# Patient Record
Sex: Female | Born: 1945 | Race: White | Hispanic: No | Marital: Married
Health system: Midwestern US, Community
[De-identification: ages and names within clinical notes are randomized; demographics above are authoritative.]

## PROBLEM LIST (undated history)

## (undated) DIAGNOSIS — I10 Essential (primary) hypertension: Secondary | ICD-10-CM

## (undated) DIAGNOSIS — E119 Type 2 diabetes mellitus without complications: Secondary | ICD-10-CM

## (undated) DIAGNOSIS — M1990 Unspecified osteoarthritis, unspecified site: Secondary | ICD-10-CM

## (undated) DIAGNOSIS — N816 Rectocele: Secondary | ICD-10-CM

## (undated) HISTORY — PX: CHOLECYSTECTOMY: SHX55

## (undated) HISTORY — PX: KNEE ARTHROSCOPY: SUR90

## (undated) HISTORY — PX: APPENDECTOMY: SHX54

---

## 2003-07-29 ENCOUNTER — Other Ambulatory Visit: Payer: Self-pay

## 2004-11-11 ENCOUNTER — Emergency Department: Payer: Self-pay | Admitting: Emergency Medicine

## 2006-03-28 ENCOUNTER — Emergency Department: Payer: Self-pay | Admitting: Emergency Medicine

## 2010-12-20 ENCOUNTER — Emergency Department: Payer: Self-pay | Admitting: Emergency Medicine

## 2012-08-07 ENCOUNTER — Emergency Department: Payer: Self-pay | Admitting: Emergency Medicine

## 2012-08-07 LAB — COMPREHENSIVE METABOLIC PANEL
Alkaline Phosphatase: 100 U/L (ref 50–136)
Anion Gap: 5 — ABNORMAL LOW (ref 7–16)
BUN: 20 mg/dL — ABNORMAL HIGH (ref 7–18)
Bilirubin,Total: 0.3 mg/dL (ref 0.2–1.0)
Calcium, Total: 8.7 mg/dL (ref 8.5–10.1)
Chloride: 104 mmol/L (ref 98–107)
Co2: 25 mmol/L (ref 21–32)
EGFR (African American): 43 — ABNORMAL LOW
EGFR (Non-African Amer.): 37 — ABNORMAL LOW
Glucose: 131 mg/dL — ABNORMAL HIGH (ref 65–99)
Potassium: 4.3 mmol/L (ref 3.5–5.1)
SGOT(AST): 24 U/L (ref 15–37)
SGPT (ALT): 17 U/L (ref 12–78)
Total Protein: 7.4 g/dL (ref 6.4–8.2)

## 2012-08-07 LAB — URINALYSIS, COMPLETE
Bilirubin,UR: NEGATIVE
Glucose,UR: NEGATIVE mg/dL (ref 0–75)
Hyaline Cast: 3
Nitrite: NEGATIVE
Ph: 6 (ref 4.5–8.0)
Protein: NEGATIVE
RBC,UR: 5 /HPF (ref 0–5)
Squamous Epithelial: 2

## 2012-08-07 LAB — CBC
HCT: 37 % (ref 35.0–47.0)
HGB: 11.7 g/dL — ABNORMAL LOW (ref 12.0–16.0)
MCH: 26 pg (ref 26.0–34.0)
MCHC: 31.5 g/dL — ABNORMAL LOW (ref 32.0–36.0)
MCV: 82 fL (ref 80–100)
Platelet: 304 10*3/uL (ref 150–440)
RDW: 15.3 % — ABNORMAL HIGH (ref 11.5–14.5)

## 2012-08-07 LAB — LIPASE, BLOOD: Lipase: 115 U/L (ref 73–393)

## 2015-03-02 ENCOUNTER — Emergency Department: Payer: Medicare Other

## 2015-03-02 ENCOUNTER — Inpatient Hospital Stay
Admission: EM | Admit: 2015-03-02 | Discharge: 2015-03-03 | DRG: 176 | Disposition: A | Discharge status: Home / Self Care | Payer: Medicare Other | Attending: Internal Medicine | Admitting: Internal Medicine

## 2015-03-02 ENCOUNTER — Encounter: Payer: Self-pay | Admitting: General Practice

## 2015-03-02 DIAGNOSIS — R55 Syncope and collapse: Secondary | ICD-10-CM

## 2015-03-02 DIAGNOSIS — M069 Rheumatoid arthritis, unspecified: Secondary | ICD-10-CM | POA: Diagnosis present

## 2015-03-02 DIAGNOSIS — I10 Essential (primary) hypertension: Secondary | ICD-10-CM | POA: Diagnosis present

## 2015-03-02 DIAGNOSIS — I2699 Other pulmonary embolism without acute cor pulmonale: Principal | ICD-10-CM | POA: Diagnosis present

## 2015-03-02 DIAGNOSIS — I340 Nonrheumatic mitral (valve) insufficiency: Secondary | ICD-10-CM | POA: Diagnosis not present

## 2015-03-02 DIAGNOSIS — D6859 Other primary thrombophilia: Secondary | ICD-10-CM | POA: Diagnosis present

## 2015-03-02 DIAGNOSIS — Z794 Long term (current) use of insulin: Secondary | ICD-10-CM | POA: Diagnosis not present

## 2015-03-02 DIAGNOSIS — R0602 Shortness of breath: Secondary | ICD-10-CM

## 2015-03-02 DIAGNOSIS — Z882 Allergy status to sulfonamides status: Secondary | ICD-10-CM

## 2015-03-02 DIAGNOSIS — E119 Type 2 diabetes mellitus without complications: Secondary | ICD-10-CM | POA: Diagnosis present

## 2015-03-02 HISTORY — DX: Unspecified osteoarthritis, unspecified site: M19.90

## 2015-03-02 HISTORY — DX: Type 2 diabetes mellitus without complications: E11.9

## 2015-03-02 HISTORY — DX: Essential (primary) hypertension: I10

## 2015-03-02 LAB — CBC
HCT: 38.4 % (ref 35.0–47.0)
Hemoglobin: 12.6 g/dL (ref 12.0–16.0)
MCH: 26.8 pg (ref 26.0–34.0)
MCHC: 32.8 g/dL (ref 32.0–36.0)
MCV: 81.7 fL (ref 80.0–100.0)
PLATELETS: 190 10*3/uL (ref 150–440)
RBC: 4.7 MIL/uL (ref 3.80–5.20)
RDW: 15.4 % — AB (ref 11.5–14.5)
WBC: 10.3 10*3/uL (ref 3.6–11.0)

## 2015-03-02 LAB — TROPONIN I
TROPONIN I: 0.03 ng/mL (ref <0.031)
TROPONIN I: 0.04 ng/mL — AB (ref <0.031)

## 2015-03-02 LAB — BASIC METABOLIC PANEL
ANION GAP: 10 (ref 5–15)
BUN: 18 mg/dL (ref 6–20)
CALCIUM: 8.5 mg/dL — AB (ref 8.9–10.3)
CO2: 24 mmol/L (ref 22–32)
CREATININE: 1.28 mg/dL — AB (ref 0.44–1.00)
Chloride: 98 mmol/L — ABNORMAL LOW (ref 101–111)
GFR, EST AFRICAN AMERICAN: 48 mL/min — AB (ref >60)
GFR, EST NON AFRICAN AMERICAN: 42 mL/min — AB (ref >60)
Glucose, Bld: 220 mg/dL — ABNORMAL HIGH (ref 65–99)
Potassium: 4.6 mmol/L (ref 3.5–5.1)
SODIUM: 132 mmol/L — AB (ref 135–145)

## 2015-03-02 LAB — PROTIME-INR
INR: 0.99
Prothrombin Time: 13.3 seconds (ref 11.4–15.0)

## 2015-03-02 LAB — GLUCOSE, CAPILLARY: Glucose-Capillary: 167 mg/dL — ABNORMAL HIGH (ref 65–99)

## 2015-03-02 LAB — APTT: aPTT: 23 seconds — ABNORMAL LOW (ref 24–36)

## 2015-03-02 LAB — BRAIN NATRIURETIC PEPTIDE: B Natriuretic Peptide: 103 pg/mL — ABNORMAL HIGH (ref 0.0–100.0)

## 2015-03-02 MED ORDER — ONDANSETRON HCL 4 MG PO TABS: 4.0000 mg | ORAL_TABLET | Freq: Four times a day (QID) | ORAL | Status: DC | PRN

## 2015-03-02 MED ORDER — HEPARIN BOLUS VIA INFUSION
4000.0000 [IU] | Freq: Once | INTRAVENOUS | Status: AC
  Administered 2015-03-02: 4000 [IU] via INTRAVENOUS
  Filled 2015-03-02: qty 4000

## 2015-03-02 MED ORDER — SODIUM CHLORIDE 0.9 % IV SOLN
INTRAVENOUS | Status: DC
  Administered 2015-03-02: 22:00:00 via INTRAVENOUS

## 2015-03-02 MED ORDER — LISINOPRIL 5 MG PO TABS
5.0000 mg | ORAL_TABLET | Freq: Every day | ORAL | Status: DC
  Administered 2015-03-03: 5 mg via ORAL
  Filled 2015-03-02: qty 1

## 2015-03-02 MED ORDER — INSULIN ASPART 100 UNIT/ML ~~LOC~~ SOLN
21.0000 [IU] | Freq: Three times a day (TID) | SUBCUTANEOUS | Status: DC
  Administered 2015-03-03: 21 [IU] via SUBCUTANEOUS
  Filled 2015-03-02: qty 21

## 2015-03-02 MED ORDER — HEPARIN (PORCINE) IN NACL 100-0.45 UNIT/ML-% IJ SOLN
1200.0000 [IU]/h | INTRAMUSCULAR | Status: DC
  Administered 2015-03-02: 1200 [IU]/h via INTRAVENOUS
  Filled 2015-03-02 (×3): qty 250

## 2015-03-02 MED ORDER — IOHEXOL 350 MG/ML SOLN
100.0000 mL | Freq: Once | INTRAVENOUS | Status: AC | PRN
  Administered 2015-03-02: 100 mL via INTRAVENOUS

## 2015-03-02 MED ORDER — ACETAMINOPHEN 650 MG RE SUPP: 650.0000 mg | Freq: Four times a day (QID) | RECTAL | Status: DC | PRN

## 2015-03-02 MED ORDER — IOHEXOL 300 MG/ML  SOLN: 100.0000 mL | Freq: Once | INTRAMUSCULAR | Status: DC | PRN

## 2015-03-02 MED ORDER — INSULIN GLARGINE 100 UNIT/ML ~~LOC~~ SOLN
61.0000 [IU] | Freq: Every day | SUBCUTANEOUS | Status: DC
  Administered 2015-03-02: 61 [IU] via SUBCUTANEOUS
  Filled 2015-03-02 (×2): qty 0.61

## 2015-03-02 MED ORDER — SODIUM CHLORIDE 0.9 % IJ SOLN
3.0000 mL | Freq: Two times a day (BID) | INTRAMUSCULAR | Status: DC
  Administered 2015-03-02: 3 mL via INTRAVENOUS

## 2015-03-02 MED ORDER — ASPIRIN EC 81 MG PO TBEC
81.0000 mg | DELAYED_RELEASE_TABLET | Freq: Every day | ORAL | Status: DC
  Administered 2015-03-02 – 2015-03-03 (×2): 81 mg via ORAL
  Filled 2015-03-02 (×2): qty 1

## 2015-03-02 MED ORDER — PANTOPRAZOLE SODIUM 40 MG IV SOLR
40.0000 mg | Freq: Two times a day (BID) | INTRAVENOUS | Status: DC
  Administered 2015-03-02 – 2015-03-03 (×2): 40 mg via INTRAVENOUS
  Filled 2015-03-02 (×2): qty 40

## 2015-03-02 MED ORDER — DOCUSATE SODIUM 100 MG PO CAPS
100.0000 mg | ORAL_CAPSULE | Freq: Two times a day (BID) | ORAL | Status: DC
  Filled 2015-03-02 (×2): qty 1

## 2015-03-02 MED ORDER — MORPHINE SULFATE 2 MG/ML IJ SOLN
2.0000 mg | INTRAMUSCULAR | Status: DC | PRN
Start: 2015-03-02 — End: 2015-03-03
  Administered 2015-03-03: 2 mg via INTRAVENOUS
  Filled 2015-03-02: qty 1

## 2015-03-02 MED ORDER — ATORVASTATIN CALCIUM 10 MG PO TABS
5.0000 mg | ORAL_TABLET | Freq: Every day | ORAL | Status: DC
  Administered 2015-03-02 – 2015-03-03 (×2): 5 mg via ORAL
  Filled 2015-03-02 (×2): qty 1

## 2015-03-02 MED ORDER — HEPARIN SODIUM (PORCINE) 5000 UNIT/ML IJ SOLN
INTRAMUSCULAR | Status: AC
  Filled 2015-03-02: qty 1

## 2015-03-02 MED ORDER — ACETAMINOPHEN 325 MG PO TABS: 650.0000 mg | ORAL_TABLET | Freq: Four times a day (QID) | ORAL | Status: DC | PRN

## 2015-03-02 MED ORDER — ONDANSETRON HCL 4 MG/2ML IJ SOLN: 4.0000 mg | Freq: Four times a day (QID) | INTRAMUSCULAR | Status: DC | PRN

## 2015-03-02 MED ORDER — METOPROLOL TARTRATE 25 MG PO TABS
25.0000 mg | ORAL_TABLET | Freq: Two times a day (BID) | ORAL | Status: DC
  Administered 2015-03-02 – 2015-03-03 (×2): 25 mg via ORAL
  Filled 2015-03-02 (×2): qty 1

## 2015-03-02 MED ORDER — SODIUM CHLORIDE 0.9 % IV BOLUS (SEPSIS)
1000.0000 mL | Freq: Once | INTRAVENOUS | Status: AC
  Administered 2015-03-02: 1000 mL via INTRAVENOUS

## 2015-03-02 MED ORDER — HYDROCHLOROTHIAZIDE 25 MG PO TABS
12.5000 mg | ORAL_TABLET | Freq: Every day | ORAL | Status: DC
Start: 2015-03-03 — End: 2015-03-03
  Administered 2015-03-03: 12.5 mg via ORAL
  Filled 2015-03-02: qty 1

## 2015-03-02 NOTE — H&P (Signed)
History and Physical    Meagan Hernandez ZOX:[id number] DOB: Dec 23, 1945 DOA: 03/02/2015  Referring physician: Dr. Shaune Pollack PCP: No primary care provider on file.  Specialists: none  Chief Complaint: SOB with near syncope  HPI: [name] is a 69 y.o. female has a past medical history significant for HTN, DM, and RA now with acute SOB associated with near syncope. Found to have submassive PE on CT in ER. No recent surgery. Has not been bed-ridden. Not on hormones. Has 2 daughters with hx of "clots". Now admitted for IV Heparin and further evaluation.  Review of Systems: The patient denies anorexia, fever, weight loss,, vision loss, decreased hearing, hoarseness, chest pain, syncope, balance deficits, hemoptysis, abdominal pain, melena, hematochezia, severe indigestion/heartburn, hematuria, incontinence, genital sores, muscle weakness, suspicious skin lesions, transient blindness, difficulty walking, depression, unusual weight change, abnormal bleeding, enlarged lymph nodes, angioedema, and breast masses.   Past Medical History  Diagnosis Date  . Hypertension   . Diabetes mellitus without complication   . Arthritis    Past Surgical History  Procedure Laterality Date  . Appendectomy    . Knee arthroscopy    . Cholecystectomy     Social History:  reports that she has never smoked. She does not have any smokeless tobacco history on file. She reports that she does not drink alcohol or use illicit drugs.  Allergies  Allergen Reactions  . Other Anaphylaxis    Patient states she is allergic to steroids.  . Sulfa Antibiotics Rash    History reviewed. No pertinent family history.  Prior to Admission medications   Medication Sig Start Date End Date Taking? Authorizing Provider  atorvastatin (LIPITOR) 10 MG tablet Take 5 mg by mouth daily.   Yes Historical Provider, MD  hydrochlorothiazide (HYDRODIURIL) 12.5 MG tablet Take 12.5 mg by mouth daily.   Yes Historical Provider, MD  insulin  aspart (NOVOLOG) 100 UNIT/ML injection Inject 21 Units into the skin 3 (three) times daily with meals.   Yes Historical Provider, MD  insulin glargine (LANTUS) 100 UNIT/ML injection Inject 61 Units into the skin at bedtime.   Yes Historical Provider, MD  lisinopril (PRINIVIL,ZESTRIL) 5 MG tablet Take 5 mg by mouth daily.   Yes Historical Provider, MD  metoprolol tartrate (LOPRESSOR) 25 MG tablet Take 25 mg by mouth 2 (two) times daily.   Yes Historical Provider, MD  omeprazole (PRILOSEC) 20 MG capsule Take 20 mg by mouth 2 (two) times daily.   Yes Historical Provider, MD  vitamin E 400 UNIT capsule Take 400 Units by mouth daily.   Yes Historical Provider, MD   Physical Exam: Filed Vitals:   03/02/15 1430 03/02/15 1500 03/02/15 1539 03/02/15 1558  BP: 139/67 157/69    Pulse: 59 56    Temp:      TempSrc:      Resp: 15 17    Height:      Weight:      SpO2: 92% 88% 95% 94%     General:  No apparent distress  Eyes: PERRL, EOMI, no scleral icterus  ENT: moist oropharynx  Neck: supple, no lymphadenopathy  Cardiovascular: regular rate with 2/6 systolic murmur noted. No rubs or gallops; 2+ peripheral pulses, no JVD, trace peripheral edema  Respiratory: CTA biL, good air movement . Basilar rhonchi noted  Abdomen: soft, non tender to palpation, positive bowel sounds, no guarding, no rebound  Skin: no rashes  Musculoskeletal: normal bulk and tone, no joint swelling  Psychiatric: normal mood and affect  Neurologic: CN 2-12 grossly intact, MS 5/5 in all 4  Labs on Admission:  Basic Metabolic Panel:  Recent Labs Lab 03/02/15 1242  NA 132*  K 4.6  CL 98*  CO2 24  GLUCOSE 220*  BUN 18  CREATININE 1.28*  CALCIUM 8.5*   Liver Function Tests: No results for input(s): AST, ALT, ALKPHOS, BILITOT, PROT, ALBUMIN in the last 168 hours. No results for input(s): LIPASE, AMYLASE in the last 168 hours. No results for input(s): AMMONIA in the last 168 hours. CBC:  Recent Labs Lab  03/02/15 1242  WBC 10.3  HGB 12.6  HCT 38.4  MCV 81.7  PLT 190   Cardiac Enzymes:  Recent Labs Lab 03/02/15 1242  TROPONINI 0.03    BNP (last 3 results)  Recent Labs  03/02/15 1242  BNP 103.0*    ProBNP (last 3 results) No results for input(s): PROBNP in the last 8760 hours.  CBG: No results for input(s): GLUCAP in the last 168 hours.  Radiological Exams on Admission: Dg Chest 2 View  03/02/2015   CLINICAL DATA:  Multiple syncopal episodes and shortness of breath over the past few days.  EXAM: CHEST  2 VIEW  COMPARISON:  12/20/2010  FINDINGS: The cardiac silhouette, mediastinal and hilar contours are within normal limits and stable. The lungs demonstrate mild chronic bronchitic type interstitial changes but no infiltrates, edema or effusions. No pulmonary lesions. The bony thorax is intact.  IMPRESSION: No acute cardiopulmonary findings.   Electronically Signed   By: Rudie Meyer M.D.   On: 03/02/2015 15:30   Ct Angio Chest Pe W/cm &/or Wo Cm  03/02/2015   CLINICAL DATA:  Crushing chest pain since this morning with shortness of breath, weakness and dizziness.  EXAM: CT ANGIOGRAPHY CHEST WITH CONTRAST  TECHNIQUE: Multidetector CT imaging of the chest was performed using the standard protocol during bolus administration of intravenous contrast. Multiplanar CT image reconstructions and MIPs were obtained to evaluate the vascular anatomy.  CONTRAST:  OMNIPAQUE IOHEXOL 350 MG/ML SOLN  COMPARISON:  Chest x-ray, same date.  FINDINGS: Chest wall: No breast mass, supraclavicular or axillary lymphadenopathy. The bony thorax is intact.  Mediastinum: The heart is upper limits of normal in size. No pericardial effusion. No mediastinal or hilar mass or adenopathy. The aorta is normal in caliber. No dissection. Coronary artery calcifications are noted.  The pulmonary arterial tree is fairly well opacified. There is significant pulmonary embolism considered sub massive with clot in the main  pulmonary artery and extending into both the right and left pulmonary arteries and into the subsegmental branches. There is right heart strain with flattening of the left ventricle. The RV LV ratio is 1.1.  Lungs/ pleura: Patchy ground-glass opacity is likely pulmonary edema. There also areas of atelectasis. No obvious pulmonary infarcts. No pleural effusion.  Upper abdomen:  No significant findings.  Review of the MIP images confirms the above findings.  IMPRESSION: 1. Positive for acute PE with CT evidence of right heart strain (RV/LV Ratio = 1.1) consistent with at least submassive (intermediate risk) PE. The presence of right heart strain has been associated with an increased risk of morbidity and mortality. Please activate Code PE by paging [phone]. 2. Normal thoracic aorta. 3. Patchy areas of atelectasis and edema in the lungs but no evidence of pulmonary infarction or pleural effusion. These results were called by telephone at the time of interpretation on 03/02/2015 at 5:40 pm to Dr. Governor Rooks , who verbally acknowledged these results.  Electronically Signed   By: Rudie Meyer M.D.   On: 03/02/2015 17:40    EKG: Independently reviewed.  Assessment/Plan Principal Problem:   Pulmonary embolism Active Problems:   Near syncope   SOB (shortness of breath)   Will admit to telemetry with IV Heparin, O2. Coagulopathy labs sent. Order echo. Consult Vascular surgery. Repeat labs in AM. Follow sugars.  Diet: clear liquid Fluids: NS@75  DVT Prophylaxis: IV Heparin  Code Status: FULL  Family Communication: none  Disposition Plan: home  Time spent: 50 min

## 2015-03-02 NOTE — ED Provider Notes (Signed)
[facility] Emergency Department Provider Note   ____________________________________________  Time seen: 3 PM I have reviewed the triage vital signs and the triage nursing note.  HISTORY  Chief Complaint Shortness of Breath; Chest Pain; Weakness; and Dizziness   Historian Patient  HPI Meagan Hernandez is a 69 y.o. female who has been fatigued and in a lot of pain throughout her different joints over the past several weeks associated with her diagnosis of rheumatoid arthritis. She gets her primary care at the [facility] and has an appointment with a rheumatologist on July 18. This morning she was feeling extremely weak and was walking with her cane back from the bathroom when she became extremely lightheaded and dizzy and "saw stars "end during this episode she experienced central crushing chest pain and then she laid down on the bed. She was short of breath and nausea at the time. No diaphoresis or emesis. She rested and then got back up. She had a second episode which was very similar where she nearly passed out and felt lightheaded with some chest pain this afternoon when she was running errands. She's had a decreased appetite. She does have a, wheelchair, and walker available to her. She lives with her husband.    Past Medical History  Diagnosis Date  . Hypertension   . Diabetes mellitus without complication   . Arthritis     There are no active problems to display for this patient.   Past Surgical History  Procedure Laterality Date  . Appendectomy    . Knee arthroscopy    . Cholecystectomy      Current Outpatient Rx  Name  Route  Sig  Dispense  Refill  . atorvastatin (LIPITOR) 10 MG tablet   Oral   Take 5 mg by mouth daily.         . hydrochlorothiazide (HYDRODIURIL) 12.5 MG tablet   Oral   Take 12.5 mg by mouth daily.         . insulin aspart (NOVOLOG) 100 UNIT/ML injection   Subcutaneous   Inject 21 Units into the skin 3 (three) times daily  with meals.         . insulin glargine (LANTUS) 100 UNIT/ML injection   Subcutaneous   Inject 61 Units into the skin at bedtime.         [name] lisinopril (PRINIVIL,ZESTRIL) 5 MG tablet   Oral   Take 5 mg by mouth daily.         . metoprolol tartrate (LOPRESSOR) 25 MG tablet   Oral   Take 25 mg by mouth 2 (two) times daily.         [name] omeprazole (PRILOSEC) 20 MG capsule   Oral   Take 20 mg by mouth 2 (two) times daily.         . vitamin E 400 UNIT capsule   Oral   Take 400 Units by mouth daily.           Allergies Other and Sulfa antibiotics  No family history on file.  Social History History  Substance Use Topics  . Smoking status: Never Smoker   . Smokeless tobacco: Not on file  . Alcohol Use: No    Review of Systems  Constitutional: Negative for fever. Eyes: Negative for visual changes at present ENT: Negative for sore throat, or sinus congestion. She has had mild nonproductive cough. Cardiovascular: Negative for palpitations Respiratory: Negative for wheezing Gastrointestinal: No abdominal pain. Positive nausea, and diarrhea this morning.  Genitourinary: Negative for dysuria. Musculoskeletal: Negative for back pain. Joint pains including her right wrist and left finger joints. Skin: Negative for rash. Neurological: Negative for headaches, focal weakness or numbness. 10 point Review of Systems otherwise negative ____________________________________________   PHYSICAL EXAM:  VITAL SIGNS: ED Triage Vitals  Enc Vitals Group     BP 03/02/15 1238 134/51 mmHg     Pulse Rate 03/02/15 1238 64     Resp 03/02/15 1238 19     Temp 03/02/15 1238 98.3 F (36.8 C)     Temp Source 03/02/15 1238 Oral     SpO2 03/02/15 1238 95 %     Weight 03/02/15 1238 251 lb (113.853 kg)     Height 03/02/15 1238 5\' 1"  (1.549 m)     Head Cir --      Peak Flow --      Pain Score 03/02/15 1239 5     Pain Loc --      Pain Edu? --      Excl. in GC? --       Constitutional: Alert and oriented. Well appearing and in no distress. Eyes: Conjunctivae are normal. PERRL. Normal extraocular movements. ENT   Head: Normocephalic and atraumatic.   Nose: No congestion/rhinnorhea.   Mouth/Throat: Mucous membranes are mildly dry.   Neck: No stridor. Cardiovascular/Chest: Normal rate, regular rhythm.  No murmurs, rubs, or gallops. Respiratory: Normal respiratory effort without tachypnea nor retractions. Breath sounds are clear and equal bilaterally. No wheezes/rales/rhonchi. Gastrointestinal: Soft. No distention, no guarding, no rebound. Nontender. Obese  Genitourinary/rectal:Deferred Musculoskeletal: Mild lower extremity edema. No calf girth discrepancy. No calf tenderness. Right lateral wrist tender with mild erythema . No swelling at that wrist. Neurologic:  Normal speech and language. No gross or focal neurologic deficits are appreciated. Skin:  Skin is warm, dry and intact. Psychiatric: Mood and affect are normal. Speech and behavior are normal. Patient exhibits appropriate insight and judgment.  ____________________________________________   EKG I, 05/03/15, MD, the attending physician have personally viewed and interpreted all ECGs.  Sinus bradycardia. 9 bpm. Normal axis. Narrow QRS. Normal ST and T-wave ____________________________________________  LABS (pertinent positives/negatives)  CBC within normal limits Metabolic panel significant for creatinine 1.28, glucose 220, sodium 132 and otherwise within normal limits Troponin 0.03 BNP 103  ____________________________________________  RADIOLOGY All Xrays were viewed by me. Imaging interpreted by Radiologist. I discussed with radiologist over the phone the results of the massive PE  Chest x-ray two-view: Negative  Chest CT with contrast: Sudden massive bilateral PE with evidence of right heart  strain __________________________________________  PROCEDURES  Procedure(s) performed: None Critical Care performed: None  ____________________________________________   ED COURSE / ASSESSMENT AND PLAN  CONSULTATIONS: Face-to-face consultation with the hospitalist for admission  Pertinent labs & imaging results that were available during my care of the patient were reviewed by me and considered in my medical decision making (see chart for details).   Patient's symptoms sound like they could've been orthostatic or dehydration related. However her examination and evaluation were reassuring. There was one oxygen sat that showed 88% and while walking patient's O2 sat dropped to 89% and she was very short of breath, and she states this is unusual for her. This prompted me to proceed with evaluation to rule out PE. Her chest CT did show submassive PE bilaterally. I discussed the results with her. She is being started on heparin. She'll be admitted to the hospitalist.  Patient / Family / Caregiver informed of clinical course,  medical decision-making process, and agree with plan.   I discussed return precautions, follow-up instructions, and discharged instructions with patient and/or family.  ___________________________________________   FINAL CLINICAL IMPRESSION(S) / ED DIAGNOSES   Final diagnoses:  Near syncope  Pulmonary embolism       Governor Rooks, MD 03/02/15 [phone]

## 2015-03-02 NOTE — Progress Notes (Signed)
ANTICOAGULATION CONSULT NOTE - Initial Consult  Pharmacy Consult for Heparin drip Indication: pulmonary embolus  Allergies  Allergen Reactions  . Other Anaphylaxis    Patient states she is allergic to steroids.  . Sulfa Antibiotics Rash    Patient Measurements: Height: 5\' 4"  (162.6 cm) Weight: 180 lb (81.647 kg) IBW/kg (Calculated) : 54.7 Heparin Dosing Weight: 72.4 kg  Vital Signs: Temp: 98.6 F (37 C) (07/09 1330) Temp Source: Oral (07/09 1330) BP: 157/69 mmHg (07/09 1500) Pulse Rate: 56 (07/09 1500)  Labs:  Recent Labs  03/02/15 1242  HGB 12.6  HCT 38.4  PLT 190  CREATININE 1.28*  TROPONINI 0.03    Estimated Creatinine Clearance: 42.9 mL/min (by C-G formula based on Cr of 1.28).   Medical History: Past Medical History  Diagnosis Date  . Hypertension   . Diabetes mellitus without complication   . Arthritis     Medications:  Infusions:  . heparin 1,200 Units/hr (03/02/15 1841)    Assessment: Patient with massive PE causing heart strain to be initiated on Heparin drip per pharmacy.  Goal of Therapy:  Heparin level 0.3-0.7 units/ml Monitor platelets by anticoagulation protocol: Yes   Plan:  Give 4000 units bolus x 1 Start heparin infusion at 1200 units/hr Check anti-Xa level in 6 hours and daily while on heparin Continue to monitor H&H and platelets  05/03/15, PharmD, BCPS 03/02/2015 7:26 PM

## 2015-03-02 NOTE — ED Notes (Signed)
Pt. Arrived to ed from home with reports of experiencing "crushing" chest pain this AM with SOB, weakness and dizzy. Pt alert and oriented. Pt reprots experiencing more episodes throughout day since this AM. Pt alert and oriented. Movement makes symptoms worse per pt.

## 2015-03-02 NOTE — ED Notes (Addendum)
Had patient walk down hall and back. Pt ambulated well with walker but was noticeably winded. O2 sat dropped to 87% but returned to 92% after resting for one or two minutes. Pt also stated "I'm seeing stars" when we returned to her room. RN made aware.

## 2015-03-02 NOTE — ED Notes (Signed)
Patient transported to CT 

## 2015-03-03 ENCOUNTER — Inpatient Hospital Stay (HOSPITAL_COMMUNITY)
Admit: 2015-03-03 | Discharge: 2015-03-03 | Disposition: A | Discharge status: Home / Self Care | Payer: Medicare Other | Attending: Internal Medicine | Admitting: Internal Medicine

## 2015-03-03 DIAGNOSIS — I340 Nonrheumatic mitral (valve) insufficiency: Secondary | ICD-10-CM

## 2015-03-03 LAB — CBC
HCT: 36 % (ref 35.0–47.0)
Hemoglobin: 11.8 g/dL — ABNORMAL LOW (ref 12.0–16.0)
MCH: 26.9 pg (ref 26.0–34.0)
MCHC: 32.9 g/dL (ref 32.0–36.0)
MCV: 81.9 fL (ref 80.0–100.0)
PLATELETS: 178 10*3/uL (ref 150–440)
RBC: 4.39 MIL/uL (ref 3.80–5.20)
RDW: 15.5 % — ABNORMAL HIGH (ref 11.5–14.5)
WBC: 12.1 10*3/uL — AB (ref 3.6–11.0)

## 2015-03-03 LAB — COMPREHENSIVE METABOLIC PANEL
ALT: 11 U/L — ABNORMAL LOW (ref 14–54)
ANION GAP: 7 (ref 5–15)
AST: 21 U/L (ref 15–41)
Albumin: 2.9 g/dL — ABNORMAL LOW (ref 3.5–5.0)
Alkaline Phosphatase: 72 U/L (ref 38–126)
BILIRUBIN TOTAL: 0.4 mg/dL (ref 0.3–1.2)
BUN: 16 mg/dL (ref 6–20)
CO2: 24 mmol/L (ref 22–32)
Calcium: 8.1 mg/dL — ABNORMAL LOW (ref 8.9–10.3)
Chloride: 105 mmol/L (ref 101–111)
Creatinine, Ser: 1.23 mg/dL — ABNORMAL HIGH (ref 0.44–1.00)
GFR calc non Af Amer: 44 mL/min — ABNORMAL LOW (ref >60)
GFR, EST AFRICAN AMERICAN: 51 mL/min — AB (ref >60)
Glucose, Bld: 125 mg/dL — ABNORMAL HIGH (ref 65–99)
POTASSIUM: 3.8 mmol/L (ref 3.5–5.1)
SODIUM: 136 mmol/L (ref 135–145)
Total Protein: 6.4 g/dL — ABNORMAL LOW (ref 6.5–8.1)

## 2015-03-03 LAB — GLUCOSE, CAPILLARY
GLUCOSE-CAPILLARY: 49 mg/dL — AB (ref 65–99)
GLUCOSE-CAPILLARY: 70 mg/dL (ref 65–99)
Glucose-Capillary: 264 mg/dL — ABNORMAL HIGH (ref 65–99)

## 2015-03-03 LAB — HEPARIN LEVEL (UNFRACTIONATED)
HEPARIN UNFRACTIONATED: 0.63 [IU]/mL (ref 0.30–0.70)
Heparin Unfractionated: 0.49 IU/mL (ref 0.30–0.70)

## 2015-03-03 LAB — TROPONIN I
Troponin I: 0.06 ng/mL — ABNORMAL HIGH (ref <0.031)
Troponin I: 0.04 ng/mL — ABNORMAL HIGH (ref <0.031)

## 2015-03-03 MED ORDER — RIVAROXABAN (XARELTO) VTE STARTER PACK (15 & 20 MG): ORAL_TABLET | ORAL | Status: AC

## 2015-03-03 MED ORDER — RIVAROXABAN 15 MG PO TABS
15.0000 mg | ORAL_TABLET | Freq: Two times a day (BID) | ORAL | Status: DC
  Administered 2015-03-03: 15 mg via ORAL
  Filled 2015-03-03: qty 1

## 2015-03-03 MED ORDER — DEXTROSE-NACL 5-0.9 % IV SOLN
INTRAVENOUS | Status: DC
  Administered 2015-03-03: 09:00:00 via INTRAVENOUS

## 2015-03-03 NOTE — Progress Notes (Signed)
*  PRELIMINARY RESULTS* Echocardiogram 2D Echocardiogram has been performed.  Meagan Hernandez 03/03/2015, 10:14 AM

## 2015-03-03 NOTE — Progress Notes (Signed)
Patient seen and full consult note to follow No filter placement for now as stable with PE likely present for days to weeks Agree with anticoagulation and hypercoagulable work up Will follow

## 2015-03-03 NOTE — Progress Notes (Addendum)
Pt in NAD, skin warm and dry, respirations even and unlabored.  Pt denies pain or discomfort at this time.  VSS, SR per monitor.  IV and telemetry discontinued per policy and procedure, Discharge instructions and Rx given to and reviewed with pt, verbalized understanding.  Pt discharged home.Coupon for starter kit for xarelto given to patient.

## 2015-03-03 NOTE — Discharge Summary (Signed)
[facility] [place] [facility]   PATIENT NAME: Meagan Hernandez    MR#:  [id number]  DATE OF BIRTH:  14-Jan-1946  DATE OF ADMISSION:  03/02/2015 ADMITTING PHYSICIAN: Marguarite Arbour, MD  DATE OF DISCHARGE: 03/03/2015  PRIMARY CARE PHYSICIAN: No primary care provider on file.    ADMISSION DIAGNOSIS:  Pulmonary embolism [I26.99] Near syncope [R55]  DISCHARGE DIAGNOSIS:  Principal Problem:   Pulmonary embolism Active Problems:   Near syncope   SOB (shortness of breath)   SECONDARY DIAGNOSIS:   Past Medical History  Diagnosis Date  . Hypertension   . Diabetes mellitus without complication   . Arthritis     HOSPITAL COURSE:   * Pulmonary embolism  Near syncope  SOB (shortness of breath)  Found having submassive PE- given IV heparin. Vascular sx consult done- suggested no need for filter- and she was started on Xarelto, felt much better , and able to walk without trouble or SOB. Discharge home with follow up in hematology clinic.  * Hypertension   Cont HCTZ- stable  * DM   Cont insulin.  DISCHARGE CONDITIONS:   Stable.  CONSULTS OBTAINED:  Treatment Team:  Annice Needy, MD  DRUG ALLERGIES:   Allergies  Allergen Reactions  . Other Anaphylaxis    Patient states she is allergic to steroids.  . Sulfa Antibiotics Rash    DISCHARGE MEDICATIONS:   Current Discharge Medication List    START taking these medications   Details  Rivaroxaban (XARELTO STARTER PACK) 15 & 20 MG TBPK Take as directed on package: Start with one 15mg  tablet by mouth twice a day with food. On Day 22, switch to one 20mg  tablet once a day with food. Qty: 51 each, Refills: 0      CONTINUE these medications which have NOT CHANGED   Details  atorvastatin (LIPITOR) 10 MG tablet Take 5 mg by mouth daily.    hydrochlorothiazide (HYDRODIURIL) 12.5 MG tablet Take 12.5 mg by mouth daily.    insulin aspart (NOVOLOG) 100 UNIT/ML injection Inject 21 Units into  the skin 3 (three) times daily with meals.    insulin glargine (LANTUS) 100 UNIT/ML injection Inject 61 Units into the skin at bedtime.    lisinopril (PRINIVIL,ZESTRIL) 5 MG tablet Take 5 mg by mouth daily.    metoprolol tartrate (LOPRESSOR) 25 MG tablet Take 25 mg by mouth 2 (two) times daily.    omeprazole (PRILOSEC) 20 MG capsule Take 20 mg by mouth 2 (two) times daily.    vitamin E 400 UNIT capsule Take 400 Units by mouth daily.         DISCHARGE INSTRUCTIONS:    Follow with hematology clinic.  If you experience worsening of your admission symptoms, develop shortness of breath, life threatening emergency, suicidal or homicidal thoughts you must seek medical attention immediately by calling 911 or calling your MD immediately  if symptoms less severe.  You Must read complete instructions/literature along with all the possible adverse reactions/side effects for all the Medicines you take and that have been prescribed to you. Take any new Medicines after you have completely understood and accept all the possible adverse reactions/side effects.   Please note  You were cared for by a hospitalist during your hospital stay. If you have any questions about your discharge medications or the care you received while you were in the hospital after you are discharged, you can call the unit and asked to speak with the hospitalist on call if  the hospitalist that took care of you is not available. Once you are discharged, your primary care physician will handle any further medical issues. Please note that NO REFILLS for any discharge medications will be authorized once you are discharged, as it is imperative that you return to your primary care physician (or establish a relationship with a primary care physician if you do not have one) for your aftercare needs so that they can reassess your need for medications and monitor your lab values.    Today   CHIEF COMPLAINT:   Chief Complaint  Patient  presents with  . Shortness of Breath  . Chest Pain  . Weakness  . Dizziness    HISTORY OF PRESENT ILLNESS:  Meagan Hernandez  is a 69 y.o. female with a known history of HTN, DM, and RA now with acute SOB associated with near syncope. Found to have submassive PE on CT in ER. No recent surgery. Has not been bed-ridden. Not on hormones. Has 2 daughters with hx of "clots". Now admitted for IV Heparin and further evaluation.   VITAL SIGNS:  Blood pressure 122/39, pulse 56, temperature 97.7 F (36.5 C), temperature source Oral, resp. rate 19, height 5\' 4"  (1.626 m), weight 119.886 kg (264 lb 4.8 oz), SpO2 93 %.  I/O:   Intake/Output Summary (Last 24 hours) at 03/03/15 1333 Last data filed at 03/03/15 1322  Gross per 24 hour  Intake    750 ml  Output   1500 ml  Net   -750 ml    PHYSICAL EXAMINATION:    General: No apparent distress  Eyes: PERRL, EOMI, no scleral icterus  ENT: moist oropharynx  Neck: supple, no lymphadenopathy  Cardiovascular: regular rate with 2/6 systolic murmur noted. No rubs or gallops; 2+ peripheral pulses, no JVD, trace peripheral edema  Respiratory: CTA biL, good air movement . Basilar rhonchi noted  Abdomen: soft, non tender to palpation, positive bowel sounds, no guarding, no rebound  Skin: no rashes  Musculoskeletal: normal bulk and tone, no joint swelling  Psychiatric: normal mood and affect  Neurologic: CN 2-12 grossly intact, MS 5/5 in all 4   DATA REVIEW:   CBC  Recent Labs Lab 03/03/15 0214  WBC 12.1*  HGB 11.8*  HCT 36.0  PLT 178    Chemistries   Recent Labs Lab 03/03/15 0214  NA 136  K 3.8  CL 105  CO2 24  GLUCOSE 125*  BUN 16  CREATININE 1.23*  CALCIUM 8.1*  AST 21  ALT 11*  ALKPHOS 72  BILITOT 0.4    Cardiac Enzymes  Recent Labs Lab 03/03/15 0715  TROPONINI 0.04*    Microbiology Results  No results found for this or any previous visit.  RADIOLOGY:  Dg Chest 2 View  03/02/2015   CLINICAL DATA:   Multiple syncopal episodes and shortness of breath over the past few days.  EXAM: CHEST  2 VIEW  COMPARISON:  12/20/2010  FINDINGS: The cardiac silhouette, mediastinal and hilar contours are within normal limits and stable. The lungs demonstrate mild chronic bronchitic type interstitial changes but no infiltrates, edema or effusions. No pulmonary lesions. The bony thorax is intact.  IMPRESSION: No acute cardiopulmonary findings.   Electronically Signed   By: 12/22/2010 M.D.   On: 03/02/2015 15:30   Ct Angio Chest Pe W/cm &/or Wo Cm  03/02/2015   CLINICAL DATA:  Crushing chest pain since this morning with shortness of breath, weakness and dizziness.  EXAM: CT ANGIOGRAPHY CHEST WITH  CONTRAST  TECHNIQUE: Multidetector CT imaging of the chest was performed using the standard protocol during bolus administration of intravenous contrast. Multiplanar CT image reconstructions and MIPs were obtained to evaluate the vascular anatomy.  CONTRAST:  OMNIPAQUE IOHEXOL 350 MG/ML SOLN  COMPARISON:  Chest x-ray, same date.  FINDINGS: Chest wall: No breast mass, supraclavicular or axillary lymphadenopathy. The bony thorax is intact.  Mediastinum: The heart is upper limits of normal in size. No pericardial effusion. No mediastinal or hilar mass or adenopathy. The aorta is normal in caliber. No dissection. Coronary artery calcifications are noted.  The pulmonary arterial tree is fairly well opacified. There is significant pulmonary embolism considered sub massive with clot in the main pulmonary artery and extending into both the right and left pulmonary arteries and into the subsegmental branches. There is right heart strain with flattening of the left ventricle. The RV LV ratio is 1.1.  Lungs/ pleura: Patchy ground-glass opacity is likely pulmonary edema. There also areas of atelectasis. No obvious pulmonary infarcts. No pleural effusion.  Upper abdomen:  No significant findings.  Review of the MIP images confirms the above  findings.  IMPRESSION: 1. Positive for acute PE with CT evidence of right heart strain (RV/LV Ratio = 1.1) consistent with at least submassive (intermediate risk) PE. The presence of right heart strain has been associated with an increased risk of morbidity and mortality. Please activate Code PE by paging [phone]. 2. Normal thoracic aorta. 3. Patchy areas of atelectasis and edema in the lungs but no evidence of pulmonary infarction or pleural effusion. These results were called by telephone at the time of interpretation on 03/02/2015 at 5:40 pm to Dr. Governor Rooks , who verbally acknowledged these results.   Electronically Signed   By: Rudie Meyer M.D.   On: 03/02/2015 17:40      Management plans discussed with the patient, family and they are in agreement.  CODE STATUS:     Code Status Orders        Start     Ordered   03/02/15 2006  Full code   Continuous     03/02/15 2005      TOTAL TIME TAKING CARE OF THIS PATIENT: 40 minutes.    Altamese Dilling M.D on 03/03/2015 at 1:33 PM  Between 7am to 6pm - Pager - [phone]  After 6pm go to [email or web address] - password [email or web address] [facility]  [place]  Hospitalists  Office  [phone]  CC: Primary care physician; No primary care provider on file.

## 2015-03-03 NOTE — Progress Notes (Signed)
ANTICOAGULATION CONSULT NOTE - Follow Up Consult  Pharmacy Consult for Heparin Indication: pulmonary embolus  Allergies  Allergen Reactions  . Other Anaphylaxis    Patient states she is allergic to steroids.  . Sulfa Antibiotics Rash    Patient Measurements: Height: 5\' 4"  (162.6 cm) Weight: 246 lb 14.4 oz (111.993 kg) IBW/kg (Calculated) : 54.7 Heparin Dosing Weight: 72.4 kg  Vital Signs: Temp: 99.2 F (37.3 C) (07/09 1959) Temp Source: Oral (07/09 1959) BP: 163/57 mmHg (07/09 1959) Pulse Rate: 70 (07/09 1959)  Labs:  Recent Labs  03/02/15 1242 03/02/15 1825 03/02/15 2104 03/03/15 0022  HGB 12.6  --   --   --   HCT 38.4  --   --   --   PLT 190  --   --   --   APTT  --  23*  --   --   LABPROT  --  13.3  --   --   INR  --  0.99  --   --   HEPARINUNFRC  --   --   --  0.49  CREATININE 1.28*  --   --   --   TROPONINI 0.03  --  0.04*  --     Estimated Creatinine Clearance: 50.8 mL/min (by C-G formula based on Cr of 1.28).   Medications:  Scheduled:  . aspirin EC  81 mg Oral Daily  . atorvastatin  5 mg Oral Daily  . docusate sodium  100 mg Oral BID  . heparin      . hydrochlorothiazide  12.5 mg Oral Daily  . insulin aspart  21 Units Subcutaneous TID WC  . insulin glargine  61 Units Subcutaneous QHS  . lisinopril  5 mg Oral Daily  . metoprolol tartrate  25 mg Oral BID  . pantoprazole (PROTONIX) IV  40 mg Intravenous Q12H  . sodium chloride  3 mL Intravenous Q12H   Infusions:  . sodium chloride 75 mL/hr at 03/02/15 2221  . heparin 1,200 Units/hr (03/02/15 1841)   PRN: acetaminophen **OR** acetaminophen, morphine injection, ondansetron **OR** ondansetron (ZOFRAN) IV  Assessment: Patient with massive PE causing heart strain on heparin drip with HL at goal.   Goal of Therapy:  Heparin level 0.3-0.7 units/ml Monitor platelets by anticoagulation protocol: Yes   Plan:  Will continue heparin infusion at 1200 units/hr and check another HL in 6 h.   05/03/15 D 03/03/2015,12:55 AM

## 2015-03-03 NOTE — Progress Notes (Signed)
ANTICOAGULATION CONSULT NOTE - Follow Up Consult  Pharmacy Consult for Heparin Indication: pulmonary embolus  Allergies  Allergen Reactions  . Other Anaphylaxis    Patient states she is allergic to steroids.  . Sulfa Antibiotics Rash    Patient Measurements: Height: 5\' 4"  (162.6 cm) Weight: 264 lb 4.8 oz (119.886 kg) IBW/kg (Calculated) : 54.7 Heparin Dosing Weight: 72.4 kg  Vital Signs: Temp: 98.4 F (36.9 C) (07/10 0457) Temp Source: Oral (07/10 0457) BP: 114/47 mmHg (07/10 0457) Pulse Rate: 63 (07/10 0457)  Labs:  Recent Labs  03/02/15 1242 03/02/15 1825 03/02/15 2104 03/03/15 0022 03/03/15 0214 03/03/15 0715  HGB 12.6  --   --   --  11.8*  --   HCT 38.4  --   --   --  36.0  --   PLT 190  --   --   --  178  --   APTT  --  23*  --   --   --   --   LABPROT  --  13.3  --   --   --   --   INR  --  0.99  --   --   --   --   HEPARINUNFRC  --   --   --  0.49  --  0.63  CREATININE 1.28*  --   --   --  1.23*  --   TROPONINI 0.03  --  0.04*  --  0.06*  --     Estimated Creatinine Clearance: 55.1 mL/min (by C-G formula based on Cr of 1.23).   Medications:  Scheduled:  . aspirin EC  81 mg Oral Daily  . atorvastatin  5 mg Oral Daily  . docusate sodium  100 mg Oral BID  . hydrochlorothiazide  12.5 mg Oral Daily  . insulin aspart  21 Units Subcutaneous TID WC  . insulin glargine  61 Units Subcutaneous QHS  . lisinopril  5 mg Oral Daily  . metoprolol tartrate  25 mg Oral BID  . pantoprazole (PROTONIX) IV  40 mg Intravenous Q12H  . sodium chloride  3 mL Intravenous Q12H   Infusions:  . sodium chloride 75 mL/hr at 03/02/15 2221  . heparin 1,200 Units/hr (03/02/15 1841)   PRN: acetaminophen **OR** acetaminophen, morphine injection, ondansetron **OR** ondansetron (ZOFRAN) IV  Assessment: Patient with massive PE causing heart strain on heparin drip with HL at goal.   HL at 0715=0.63  Goal of Therapy:  Heparin level 0.3-0.7 units/ml Monitor platelets by  anticoagulation protocol: Yes   Plan:  Will continue heparin infusion at 1200 units/hr as heparin level is within goal ranged.  Will recheck HL and CBC in AM.  05/03/15, PharmD Clinical Pharmacist 03/03/2015

## 2015-03-04 NOTE — Consult Note (Signed)
[facility] Vascular Consult Note  MRN : [id number]  Meagan Hernandez is a 69 y.o. (May 04, 1946) female who presents with chief complaint of  Chief Complaint  Patient presents with  . Shortness of Breath  . Chest Pain  . Weakness  . Dizziness  .  History of Present Illness: Patient presents with complaints of shortness of breath and chest pain. She has been having symptoms now for several days to maybe even 2 weeks. They worsened yesterday and prompted her admission to the hospital. The pain had been central chest pain that felt like pressure or stabbing pain. This was associated with shortness of breath with minimal activity. This was different than her normal state and she does not have a history of chest pain or cardiac problems to her knowledge. She has not really had any lower extremity symptoms. She has no recent immobility, trauma, or surgery. She reports no clear inciting events which cause this. Since admission to the hospital and initiation of anticoagulation, her symptoms have significantly improved. She is comfortable on room air and not hypoxic. She has no labored respirations currently. A CT scan of the chest is been performed which I have independently reviewed. This demonstrates sub-massive PE with bilateral pulmonary emboli present. There is also question about right heart strain on her CT scan. We are consulted for further evaluation and consideration for IVC filter or other procedures.  No current facility-administered medications for this encounter.   Current Outpatient Prescriptions  Medication Sig Dispense Refill  . atorvastatin (LIPITOR) 10 MG tablet Take 5 mg by mouth daily.    . hydrochlorothiazide (HYDRODIURIL) 12.5 MG tablet Take 12.5 mg by mouth daily.    . insulin aspart (NOVOLOG) 100 UNIT/ML injection Inject 21 Units into the skin 3 (three) times daily with meals.    . insulin glargine (LANTUS) 100 UNIT/ML injection Inject 61 Units into the  skin at bedtime.    [name] lisinopril (PRINIVIL,ZESTRIL) 5 MG tablet Take 5 mg by mouth daily.    . metoprolol tartrate (LOPRESSOR) 25 MG tablet Take 25 mg by mouth 2 (two) times daily.    [name] omeprazole (PRILOSEC) 20 MG capsule Take 20 mg by mouth 2 (two) times daily.    . vitamin E 400 UNIT capsule Take 400 Units by mouth daily.    . Rivaroxaban (XARELTO STARTER PACK) 15 & 20 MG TBPK Take as directed on package: Start with one 15mg  tablet by mouth twice a day with food. On Day 22, switch to one 20mg  tablet once a day with food. 51 each 0    Past Medical History  Diagnosis Date  . Hypertension   . Diabetes mellitus without complication   . Arthritis     Past Surgical History  Procedure Laterality Date  . Appendectomy    . Knee arthroscopy    . Cholecystectomy      Social History History  Substance Use Topics  . Smoking status: Never Smoker   . Smokeless tobacco: Not on file  . Alcohol Use: No   lives at home. Accompanied by daughter and 2 sisters today.  Family History 2 daughters have had DVT and required anticoagulation. She also reports 2 aunts who had venous issues requiring surgery. She thinks they had blood clots as well but she is not sure. No history of bleeding disorders, autoimmune diseases, or aneurysms.  Allergies  Allergen Reactions  . Other Anaphylaxis    Patient states she is allergic to steroids.  . Sulfa  Antibiotics Rash     REVIEW OF SYSTEMS (Negative unless checked)  Constitutional: [] Weight loss  [] Fever  [] Chills Cardiac: [x] Chest pain   [] Chest pressure   [] Palpitations   [] Shortness of breath when laying flat   [] Shortness of breath at rest   [x] Shortness of breath with exertion. Vascular:  [] Pain in legs with walking   [] Pain in legs at rest   [] Pain in legs when laying flat   [] Claudication   [] Pain in feet when walking  [] Pain in feet at rest  [] Pain in feet when laying flat   [] History of DVT   [] Phlebitis   [] Swelling in legs   [] Varicose veins    [] Non-healing ulcers Pulmonary:   [] Uses home oxygen   [] Productive cough   [] Hemoptysis   [] Wheeze  [] COPD   [] Asthma Neurologic:  [] Dizziness  [] Blackouts   [] Seizures   [] History of stroke   [] History of TIA  [] Aphasia   [] Temporary blindness   [] Dysphagia   [] Weakness or numbness in arms   [] Weakness or numbness in legs Musculoskeletal:  [] Arthritis   [] Joint swelling   [] Joint pain   [] Low back pain Hematologic:  [] Easy bruising  [] Easy bleeding   [] Hypercoagulable state   [] Anemic  [] Hepatitis Gastrointestinal:  [] Blood in stool   [] Vomiting blood  [] Gastroesophageal reflux/heartburn   [] Difficulty swallowing. Genitourinary:  [] Chronic kidney disease   [] Difficult urination  [] Frequent urination  [] Burning with urination   [] Blood in urine Skin:  [] Rashes   [] Ulcers   [] Wounds Psychological:  [] History of anxiety   []  History of major depression.  Physical Examination  Filed Vitals:   03/02/15 1600 03/02/15 1959 03/03/15 0457 03/03/15 1124  BP: 139/65 163/57 114/47 122/39  Pulse: 58 70 63 56  Temp:  99.2 F (37.3 C) 98.4 F (36.9 C) 97.7 F (36.5 C)  TempSrc:  Oral Oral Oral  Resp: 19 18 16 19   Height:      Weight:  246 lb 14.4 oz (111.993 kg) 264 lb 4.8 oz (119.886 kg)   SpO2: 94% 94% 92% 93%   Body mass index is 45.34 kg/(m^2). Gen:  WD/WN, NAD Head: [place]/AT, No temporalis wasting. Prominent temp pulse not noted. Ear/Nose/Throat: Hearing grossly intact, nares w/o erythema or drainage, oropharynx w/o Erythema/Exudate Eyes: PERRLA, EOMI.  Neck: Supple  No JVD.  Pulmonary:  Good air movement, clear to auscultation bilaterally. Breathing is nonlabored with no use of accessory muscles. Cardiac: RRR, normal S1, S2, no Murmurs,  Vascular:  Vessel Right Left  Radial Palpable Palpable  Ulnar Palpable Palpable  Brachial Palpable Palpable  Carotid Palpable, without bruit Palpable, without bruit  Aorta Not palpable N/A  Femoral Palpable Palpable  Popliteal Palpable Palpable  PT  Palpable Palpable  DP Palpable Palpable   Gastrointestinal: soft, non-tender/non-distended. No guarding/reflex. No masses, surgical incisions, or scars. Musculoskeletal: M/S 5/5 throughout.  Extremities without ischemic changes.  No deformity or atrophy. No edema. Neurologic: CN 2-12 intact. Pain and light touch intact in extremities.  Symmetrical.  Speech is fluent. Motor exam as listed above. Psychiatric: Judgment intact, Mood & affect appropriate for pt's clinical situation. Dermatologic: No rashes or ulcers noted.  No cellulitis or open wounds. Lymph : No Cervical, Axillary, or Inguinal lymphadenopathy.   CBC Lab Results  Component Value Date   WBC 12.1* 03/03/2015   HGB 11.8* 03/03/2015   HCT 36.0 03/03/2015   MCV 81.9 03/03/2015   PLT 178 03/03/2015    BMET    Component Value Date/Time   NA  136 03/03/2015 0214   NA 134* 08/07/2012 1549   K 3.8 03/03/2015 0214   K 4.3 08/07/2012 1549   CL 105 03/03/2015 0214   CL 104 08/07/2012 1549   CO2 24 03/03/2015 0214   CO2 25 08/07/2012 1549   GLUCOSE 125* 03/03/2015 0214   GLUCOSE 131* 08/07/2012 1549   BUN 16 03/03/2015 0214   BUN 20* 08/07/2012 1549   CREATININE 1.23* 03/03/2015 0214   CREATININE 1.47* 08/07/2012 1549   CALCIUM 8.1* 03/03/2015 0214   CALCIUM 8.7 08/07/2012 1549   GFRNONAA 44* 03/03/2015 0214   GFRNONAA 37* 08/07/2012 1549   GFRAA 51* 03/03/2015 0214   GFRAA 43* 08/07/2012 1549   Estimated Creatinine Clearance: 55.1 mL/min (by C-G formula based on Cr of 1.23).  COAG Lab Results  Component Value Date   INR 0.99 03/02/2015    Radiology Bilateral PE seen on CT scan which I have independently reviewed.  Also question of right heart strain.     Assessment/Plan 1. Submassive PE: The patient has been initiated on anticoagulation and has tolerated this well so far. I have discussed with she and her family the options and we have discussed whether or not we should place an IVC filter. Given her very  benign clinical status and her toleration of anticoagulation so far, I do not see a reason that she should have an IVC filter placed at this time. Should she develop worsening thrombotic issues while on anticoagulation or had bleeding issues while she is on anticoagulation, an IVC filter would then be recommended. There were be no role for thrombolysis at current and she is very clinically stable. I have discussed with the admitting physician who agrees with her plan of care at this time. 2. Likely hypercoagulable state: With 2 daughters who have had a clotting issue and now her unprovoked pulmonary embolus, I would be very suspicious of a hypercoagulable state. I have discussed this with the admitting physician who has ordered a hypercoagulable workup which I would agree with. This will help determine the length of her anticoagulation going forward. 3. Hypertension: Stable. Continue outpatient management. 4. Diabetes: Stable. Continue outpatient management. 5. History of gastritis/ulcer: No current symptoms. Will need anticoagulation. Could consider an agent that can be reversed readily, but bleeding risk may be lower on one of the newer agents. If she does have any bleeding, anticoagulation may have to be held and a filter may have to be placed.   Shyloh Derosa, MD  03/04/2015 3:09 PM

## 2015-03-07 LAB — FACTOR 5 LEIDEN

## 2015-03-07 LAB — PROTEIN C DEFICIENCY PROFILE
Protein C Activity: 79 % (ref 74–151)
Protein C, Total: 84 % (ref 70–140)

## 2015-03-07 LAB — LUPUS ANTICOAGULANT PANEL
DRVVT: 29.3 s (ref 0.0–55.1)
PTT Lupus Anticoagulant: 26.8 s (ref 0.0–50.0)

## 2015-10-14 ENCOUNTER — Inpatient Hospital Stay: Admit: 2015-10-14 | Payer: MEDICARE | Primary: Family Medicine

## 2015-10-14 DIAGNOSIS — Z01818 Encounter for other preprocedural examination: Secondary | ICD-10-CM

## 2015-10-14 LAB — METABOLIC PANEL, COMPREHENSIVE
A-G Ratio: 1.2 (ref 0.8–1.7)
ALT (SGPT): 23 U/L (ref 13–56)
AST (SGOT): 16 U/L (ref 15–37)
Albumin: 3.9 g/dL (ref 3.4–5.0)
Alk. phosphatase: 76 U/L (ref 45–117)
Anion gap: 4 mmol/L (ref 3.0–18)
BUN/Creatinine ratio: 30 — ABNORMAL HIGH (ref 12–20)
BUN: 14 MG/DL (ref 7.0–18)
Bilirubin, total: 0.5 MG/DL (ref 0.2–1.0)
CO2: 27 mmol/L (ref 21–32)
Calcium: 8.9 MG/DL (ref 8.5–10.1)
Chloride: 106 mmol/L (ref 100–108)
Creatinine: 0.47 MG/DL — ABNORMAL LOW (ref 0.6–1.3)
GFR est AA: >60 mL/min/{1.73_m2} (ref >60)
GFR est non-AA: >60 mL/min/{1.73_m2} (ref >60)
Globulin: 3.2 g/dL (ref 2.0–4.0)
Glucose: 98 mg/dL (ref 74–99)
Potassium: 4.3 mmol/L (ref 3.5–5.5)
Protein, total: 7.1 g/dL (ref 6.4–8.2)
Sodium: 137 mmol/L (ref 136–145)

## 2015-10-14 LAB — EKG, 12 LEAD, INITIAL
Atrial Rate: 67 {beats}/min
Calculated P Axis: 21 degrees
Calculated R Axis: -20 degrees
Calculated T Axis: 39 degrees
Diagnosis: NORMAL
P-R Interval: 148 ms
Q-T Interval: 424 ms
QRS Duration: 92 ms
QTC Calculation (Bezet): 448 ms
Ventricular Rate: 67 {beats}/min

## 2015-10-14 LAB — CBC WITH AUTOMATED DIFF
ABS. BASOPHILS: 0 10*3/uL (ref 0.0–0.06)
ABS. EOSINOPHILS: 0.2 10*3/uL (ref 0.0–0.4)
ABS. LYMPHOCYTES: 2 10*3/uL (ref 0.9–3.6)
ABS. MONOCYTES: 0.5 10*3/uL (ref 0.05–1.2)
ABS. NEUTROPHILS: 3.7 10*3/uL (ref 1.8–8.0)
BASOPHILS: 1 % (ref 0–2)
EOSINOPHILS: 3 % (ref 0–5)
HCT: 41.6 % (ref 35.0–45.0)
HGB: 13.8 g/dL (ref 12.0–16.0)
LYMPHOCYTES: 31 % (ref 21–52)
MCH: 29.9 PG (ref 24.0–34.0)
MCHC: 33.2 g/dL (ref 31.0–37.0)
MCV: 90.2 FL (ref 74.0–97.0)
MONOCYTES: 8 % (ref 3–10)
MPV: 10.8 FL (ref 9.2–11.8)
NEUTROPHILS: 57 % (ref 40–73)
PLATELET: 277 10*3/uL (ref 135–420)
RBC: 4.61 M/uL (ref 4.20–5.30)
RDW: 13.7 % (ref 11.6–14.5)
WBC: 6.5 10*3/uL (ref 4.6–13.2)

## 2015-10-14 NOTE — Other (Signed)
Dr Jason Fila office called, notified of patient allergy to amoxicillin.  Ancef contraindicated, office to call back with possible alternative drug for pre-op use.

## 2015-10-14 NOTE — Other (Signed)
Pt denies sleep apnea

## 2015-10-14 NOTE — Other (Signed)
At the time of the PAT interview, patient did not meet special population criteria.

## 2015-10-28 NOTE — H&P (Shared)
This 70 year old female presents for rectocele:[name]      History of Present Illness:  1.  rectocele:   The symptoms began 1 month ago and generally lasts varies. The symptoms are reported as being moderate. The symptoms occur constantly. The location is vagina. The symptoms are described as painless. Aggravating factors include rectocele. Relieving factors include nothing. She states the symptoms are chronic and are poorly controlled. patient presents for pre op for posterior colporrhaphy, sacrospinious fixation and  Xenoform graft for rectocele.       Reviewed with patient risks/benefits/alternatives to surgery. She understands that the risks include but are not limited to: bleeding, need for blood transfusion, risks associated with blood transfusion, infection, injury to internal or adjacent organs, risks associated with receiving anesthesia, post operative complications, wound infection or seperation, dvt, pulmonary embolus, pneumonia and death associated with surgery. All questions were answered and surgical consent was signed.                Gynecologic History:  Date of last PAP: 03/21/2010.   Past Systemic History    Medical History (Detailed)  Disease Onset Date Comments   carpel tunnel repair     Pregnancy  [facility] 09/12/2015 -     Surgical History/Management (Detailed)  Management Date Comments   SVD x 2  [facility] 09/12/2015 -   tonsillectomy     Diagnostics History:  ActStatus Study Ordered Completed Interpretation  Result / Report   obtained * Screening mammography digital 07/10/2014       obtained * Screening mammography digital 07/01/2012       obtained * Screening mammography digital 07/04/2013       completed Colonoscopy, diagnostic 06/20/2009 06/02/2006      completed DEXA scan 06/20/2009 06/14/2009      scheduled Dexa, Bone Density Scan 07/10/2014       completed EKG 06/24/2011 06/24/2011      completed Mammogram, screening, both breasts 06/20/2009 06/14/2009       scheduled Needle Electromyography Complete Right 07/10/2014       completed PAP smear 06/20/2009 04/19/2008      ordered Screening mammography digital 07/16/2015       result received Screening mammography digital 08/28/2015  See module     obtained US SOFT TISSUE HEAD AND NECK, Thyroid, Parathyroid, Parotid 07/10/2014       ordered X-RAY EXAM OF FOOT, Complete, Min 3 View 03/03/2010 03/03/2010 See details   X-ray shows hallus abduction angle of 11 degrees and intermetatarsal angle of 13 degrees.   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    Allergies:  Ingredient Reaction Medication Name Comment   AMOXICILLIN rash     AMPICILLIN          Family History  (Detailed)  Relationship Family Member Name Deceased Age at Death Condition Onset Age Cause of Death   Brother    Cancer, colon  N   Father    Cancer, lung  N     Social History:  (Detailed)  Preferred language  is [place].     The patient does not need an interpreter.    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    CHILDREN  Has children:  2 son(s).  Tobacco use status: Never smoked tobacco.  Smoking status: Never smoker.      ALCOHOL  There is a history of alcohol use.    3 drinks consumed weekly.  CAFFEINE  The patient uses caffeine.  LIFESTYLE  Exercise includes golf.        Review of Systems  System Neg/Pos Details   Constitutional Negative Fever.   Respiratory Negative Dyspnea.   Cardio Negative Chest pain.   GI Negative Abdominal pain.    GU Negative Dysuria, hematuria, urinary frequency and urinary incontinence.   Endocrine Negative Cold intolerance and heat intolerance.   Neuro Negative Headache.   Psych Negative Anxiety and depression.   Integumentary Negative Rash and skin lesion.   MS Negative Back pain.   Reproductive Negative Dysmenorrhea, dyspareunia, hot flashes, irregular menses and vaginal discharge.       Vital Signs     Height  Time ft in cm Last Measured Height Position   11:04 AM 5.0 1.00 154.94 06/24/2011 0     Weight/BSA/BMI  Time lb oz kg Context BMI kg/m2 BSA m2   11:04 AM 162.00  73.482  30.61      Blood Pressure  Time BP mm/Hg Position Side Site Method Cuff Size   11:04 AM 136/80 sitting right  manual adult large     Temperature/Pulse/Respiration  Time Temp F Temp C Temp Site Pulse/min Pattern Resp/ min   11:04 AM    76 regular 12     Measured By  Time Measured by   11:04 AM Einar PheasantHeather Peters       Physical Exam  Exam Findings Details   Constitutional Normal Well developed.   Neck Exam Normal Palpation - Normal. Thyroid gland - Normal.   Respiratory Normal Inspection - Normal. Auscultation - Normal. Effort - Normal.   Cardiovascular Normal Regular rhythm.  No murmurs, gallops, or rubs.   Abdomen Normal Anterior palpation -  No rebound. No abdominal tenderness. No hepatic enlargement. No hernia.   Genitourinary * Rectovaginal -  st III rectocele.   Genitourinary Normal Urethral meatus - Normal. External genitalia - Normal. Glands - Normal. Perineum - Normal. Vagina - Normal. Cervix - Normal. Uterus - Normal. Adnexa - Normal. Bladder - Normal. No suprapubic tenderness. No CVA tenderness. No vaginal discharge.   Skin Normal Inspection - Normal.   Spine * Inspection - no abnormality.   Psychiatric Normal Oriented to time, place, person and situation. Appropriate mood and affect.       Assessment/Plan  # Detail Type Description    1. Assessment Rectocele (N81.6).    Impression Plan posteroior colporrhaphy with perineorrhaophy and  insertion of Xenform graft..[name]

## 2015-10-29 ENCOUNTER — Inpatient Hospital Stay: Payer: MEDICARE

## 2015-10-29 MED ORDER — NALOXONE 0.4 MG/ML INJECTION
0.4 mg/mL | INTRAMUSCULAR | Status: DC | PRN
Start: 2015-10-29 — End: 2015-10-29

## 2015-10-29 MED ORDER — FENTANYL CITRATE (PF) 50 MCG/ML IJ SOLN
50 mcg/mL | INTRAMUSCULAR | Status: AC
Start: 2015-10-29 — End: ?

## 2015-10-29 MED ORDER — ONDANSETRON (PF) 4 MG/2 ML INJECTION
4 mg/2 mL | INTRAMUSCULAR | Status: AC
Start: 2015-10-29 — End: ?

## 2015-10-29 MED ORDER — LACTATED RINGERS IV
INTRAVENOUS | Status: DC
Start: 2015-10-29 — End: 2015-10-29
  Administered 2015-10-29 (×3): via INTRAVENOUS

## 2015-10-29 MED ORDER — FENTANYL CITRATE (PF) 50 MCG/ML IJ SOLN
50 mcg/mL | INTRAMUSCULAR | Status: DC | PRN
Start: 2015-10-29 — End: 2015-10-29
  Administered 2015-10-29 (×2): via INTRAVENOUS

## 2015-10-29 MED ORDER — LACTATED RINGERS IV
INTRAVENOUS | Status: DC
Start: 2015-10-29 — End: 2015-10-29

## 2015-10-29 MED ORDER — SODIUM CHLORIDE 0.9 % IV
10 mg/mL | INTRAVENOUS | Status: DC | PRN
Start: 2015-10-29 — End: 2015-10-29
  Administered 2015-10-29 (×3): via INTRAVENOUS

## 2015-10-29 MED ORDER — DEXAMETHASONE SODIUM PHOSPHATE 4 MG/ML IJ SOLN
4 mg/mL | INTRAMUSCULAR | Status: DC | PRN
Start: 2015-10-29 — End: 2015-10-29
  Administered 2015-10-29: 17:00:00 via INTRAVENOUS

## 2015-10-29 MED ORDER — EPHEDRINE (PF) 50 MG/10 ML (5 MG/ML) IN NS IV SYRINGE
50 mg/10 mL (5 mg/mL) | INTRAVENOUS | Status: AC
Start: 2015-10-29 — End: ?

## 2015-10-29 MED ORDER — LIDOCAINE (PF) 20 MG/ML (2 %) IJ SOLN
20 mg/mL (2 %) | INTRAMUSCULAR | Status: DC | PRN
Start: 2015-10-29 — End: 2015-10-29
  Administered 2015-10-29: 16:00:00 via INTRAVENOUS

## 2015-10-29 MED ORDER — MIDAZOLAM 1 MG/ML IJ SOLN
1 mg/mL | INTRAMUSCULAR | Status: AC
Start: 2015-10-29 — End: ?

## 2015-10-29 MED ORDER — EPHEDRINE (PF) 50 MG/10 ML (5 MG/ML) IN NS IV SYRINGE
50 mg/10 mL (5 mg/mL) | INTRAVENOUS | Status: DC | PRN
Start: 2015-10-29 — End: 2015-10-29
  Administered 2015-10-29 (×4): via INTRAVENOUS

## 2015-10-29 MED ORDER — MIDAZOLAM 1 MG/ML IJ SOLN
1 mg/mL | INTRAMUSCULAR | Status: DC | PRN
Start: 2015-10-29 — End: 2015-10-29
  Administered 2015-10-29: 16:00:00 via INTRAVENOUS

## 2015-10-29 MED ORDER — LIDOCAINE (PF) 10 MG/ML (1 %) IJ SOLN
10 mg/mL (1 %) | INTRAMUSCULAR | Status: DC | PRN
Start: 2015-10-29 — End: 2015-10-29
  Administered 2015-10-29: 17:00:00 via INTRAMUSCULAR

## 2015-10-29 MED ORDER — PROPOFOL 10 MG/ML IV EMUL
10 mg/mL | INTRAVENOUS | Status: DC | PRN
Start: 2015-10-29 — End: 2015-10-29
  Administered 2015-10-29: 16:00:00 via INTRAVENOUS

## 2015-10-29 MED ORDER — HYDROMORPHONE (PF) 1 MG/ML IJ SOLN
1 mg/mL | INTRAMUSCULAR | Status: DC | PRN
Start: 2015-10-29 — End: 2015-10-29

## 2015-10-29 MED ORDER — LIDOCAINE HCL 1 % (10 MG/ML) IJ SOLN
10 mg/mL (1 %) | INTRAMUSCULAR | Status: AC
Start: 2015-10-29 — End: ?

## 2015-10-29 MED ORDER — FLUMAZENIL 0.1 MG/ML IV SOLN
0.1 mg/mL | INTRAVENOUS | Status: DC | PRN
Start: 2015-10-29 — End: 2015-10-29

## 2015-10-29 MED ORDER — LEVOFLOXACIN IN D5W 500 MG/100 ML IV PIGGY BACK
500 mg/100 mL | Freq: Once | INTRAVENOUS | Status: AC
Start: 2015-10-29 — End: 2015-10-29
  Administered 2015-10-29: 17:00:00 via INTRAVENOUS

## 2015-10-29 MED ORDER — CONJUGATED ESTROGENS 0.625 MG/G VAGINAL CREAM
0.625 mg/gram | VAGINAL | Status: AC
Start: 2015-10-29 — End: ?

## 2015-10-29 MED ORDER — ONDANSETRON (PF) 4 MG/2 ML INJECTION
4 mg/2 mL | INTRAMUSCULAR | Status: DC | PRN
Start: 2015-10-29 — End: 2015-10-29
  Administered 2015-10-29: 16:00:00 via INTRAVENOUS

## 2015-10-29 MED ORDER — CONJUGATED ESTROGENS 0.625 MG/G VAGINAL CREAM
0.625 mg/gram | VAGINAL | Status: DC | PRN
Start: 2015-10-29 — End: 2015-10-29
  Administered 2015-10-29: 17:00:00 via VAGINAL

## 2015-10-29 MED ORDER — PHENYLEPHRINE (PF) 1 MG/10 ML (100 MCG/ML) IN NS IV SYRINGE
1 mg/0 mL (00 mcg/mL) | INTRAVENOUS | Status: AC
Start: 2015-10-29 — End: ?

## 2015-10-29 MED ORDER — FENTANYL CITRATE (PF) 50 MCG/ML IJ SOLN
50 mcg/mL | INTRAMUSCULAR | Status: DC | PRN
Start: 2015-10-29 — End: 2015-10-29

## 2015-10-29 MED FILL — FENTANYL CITRATE (PF) 50 MCG/ML IJ SOLN: 50 mcg/mL | INTRAMUSCULAR | Qty: 2

## 2015-10-29 MED FILL — MIDAZOLAM 1 MG/ML IJ SOLN: 1 mg/mL | INTRAMUSCULAR | Qty: 2

## 2015-10-29 MED FILL — LIDOCAINE HCL 1 % (10 MG/ML) IJ SOLN: 10 mg/mL (1 %) | INTRAMUSCULAR | Qty: 20

## 2015-10-29 MED FILL — PHENYLEPHRINE (PF) 1 MG/10 ML (100 MCG/ML) IN NS IV SYRINGE: 1 mg/0 mL (00 mcg/mL) | INTRAVENOUS | Qty: 3

## 2015-10-29 MED FILL — ONDANSETRON (PF) 4 MG/2 ML INJECTION: 4 mg/2 mL | INTRAMUSCULAR | Qty: 2

## 2015-10-29 MED FILL — LEVOFLOXACIN IN D5W 500 MG/100 ML IV PIGGY BACK: 500 mg/100 mL | INTRAVENOUS | Qty: 100

## 2015-10-29 MED FILL — PREMARIN 0.625 MG/GRAM VAGINAL CREAM: 0.625 mg/gram | VAGINAL | Qty: 30

## 2015-10-29 MED FILL — EPHEDRINE (PF) 50 MG/10 ML (5 MG/ML) IN NS IV SYRINGE: 50 mg/10 mL (5 mg/mL) | INTRAVENOUS | Qty: 10

## 2015-10-29 MED FILL — DEXAMETHASONE SODIUM PHOSPHATE 4 MG/ML IJ SOLN: 4 mg/mL | INTRAMUSCULAR | Qty: 2

## 2015-10-29 MED FILL — PROPOFOL 10 MG/ML IV EMUL: 10 mg/mL | INTRAVENOUS | Qty: 15

## 2015-10-29 MED FILL — LACTATED RINGERS IV: INTRAVENOUS | Qty: 1000

## 2015-10-29 MED FILL — XYLOCAINE-MPF 20 MG/ML (2 %) INJECTION SOLUTION: 20 mg/mL (2 %) | INTRAMUSCULAR | Qty: 5

## 2015-10-29 MED FILL — PHENYLEPHRINE (PF) 1 MG/10 ML (100 MCG/ML) IN NS IV SYRINGE: 1 mg/0 mL (00 mcg/mL) | INTRAVENOUS | Qty: 10

## 2015-10-29 MED FILL — LACTATED RINGERS IV: INTRAVENOUS | Qty: 400

## 2015-10-29 MED FILL — EPHEDRINE (PF) 50 MG/10 ML (5 MG/ML) IN NS IV SYRINGE: 50 mg/10 mL (5 mg/mL) | INTRAVENOUS | Qty: 7

## 2015-10-29 NOTE — Other (Signed)
Voiding vaginal packing remains in place to be removed per pt in am

## 2015-10-29 NOTE — Op Note (Deleted)
Outpatient Operative Note     Surgeon(s): Duanne GuessLaura Shalae Belmonte, M.D.  Pre-operative Diagnosis: Complex Left Ovarian Cyst  Post-operative Diagnosis: same as preop diagnosis.  Procedure(s) Performed: Laparoscopic Bilateral Salpingo-oopheretomy                                                 Anesthesia: General  Findings:  4-5 cm complex left ovarian cyst  Complications: none   Estimated Blood Loss: Minimal  Specimens: bilateral tubes and ovaries    Procedure:   Patient was taken to the OR after informed consent had been obtained. Surgery identification was completed with the patient and the OR team. She was then placed under general anesthesia, prepped and draped in a sterile fashion. Patient identification and surgery identification were completed with the surgeon and the OR team. Attention was turned to the vagina and a sponge stick was placed. Attention was then turned to the abdomen. 6 cc of 0.25% Marcaine was injected into the umbilicus and a 5 mm incision was made. A Veress needle was placed with anterior tenting of the abdominal wall. Pneumoperitoneum was achieved with 1.2 liters of CO2 gas. An OptiView port was placed under direct visualization without complication.  High-flow insufflation was initiated and the patient was placed in steep [place].  Right and left lower quadrant trocars were then placed by first injecting 0.25 % Marcaine and making a 12 mm incision on the left and a  5 mm incision on the right.   A 12 mm bladeless trocar was inserted on the left under direct visualization without injury to the underlying contents. The same procedure was used on the right with a 5mm bladeless trocar. The abdomen was examined and images were obtained. The ureters were identified bilaterally. The left IP ligament was then transected near the left ovary distant from the left ureter.   The remaining attachments to the left  pelvic sidewall were transected.  The ovary was removed in an endopouch and sent for frozen section. The same procedure was used on the right side but the ovary was then removed directly and sent for permanent pathology. Excellent homeostasis was verified.  The abdomen was cleared of all clot and debris. Hemostasis was noted at all transection sites. Bilateral peristalsis was confirmed in the ureters. The instruments were removed from the abdomen. Hemostasis was noted at the port sites.The left lower quadrant incision fascia was closed with a figure-of-eight 0 Vicryl. The skin was closed with 4-0 Monocryl in a subcuticlar ifashion and Derma bond for the skin edges. The other incisions were closed with Derma bond. The sponge stick was removed vaginally. Sponge, lap, needle and instrument counts were correct x 2. The patient was awakened and returned to recovery in stable condition. Frozen section returned with benign pathology.     Duanne GuessLaura Rosmery Duggin, M.D.

## 2015-10-29 NOTE — Other (Signed)
TRANSFER - OUT REPORT:    Verbal report given to Susa RaringLisa RN (name) on Diane Frank  being transferred to Phase II (unit) for routine progression of care       Report consisted of patient???s Situation, Background, Assessment and   Recommendations(SBAR).     Information from the following report(s) SBAR, Kardex, Procedure Summary and Intake/Output was reviewed with the receiving nurse.    Lines:   Peripheral IV 10/29/15 Left Hand (Active)   Site Assessment Clean, dry, & intact 10/29/2015 12:39 PM   Phlebitis Assessment 0 10/29/2015 12:39 PM   Infiltration Assessment 0 10/29/2015 12:39 PM   Dressing Status Clean, dry, & intact 10/29/2015 12:39 PM   Dressing Type Transparent;Tape 10/29/2015 12:39 PM   Hub Color/Line Status Infusing 10/29/2015 12:39 PM        Opportunity for questions and clarification was provided.      Patient transported with:   O2 @ 2 liters  Registered Nurse

## 2015-10-29 NOTE — Other (Signed)
Vaginal packing intact

## 2015-10-29 NOTE — Other (Signed)
Pt up to bathroom to void.

## 2015-10-29 NOTE — Anesthesia Post-Procedure Evaluation (Signed)
Post-Anesthesia Evaluation and Assessment    Cardiovascular Function/Vital Signs  Visit Vitals   ??? BP 129/67   ??? Pulse 98   ??? Temp 37 ??C (98.6 ??F)   ??? Resp 16   ??? Ht 5' 2"  (1.575 m)   ??? Wt 73.7 kg (162 lb 8 oz)   ??? SpO2 95%   ??? BMI 29.72 kg/m2       Patient is status post Procedure(s):  POSTERIOR COLPORRHAPHY & SACROSPINOUS LIGAMENT FIXATION W/ XENFORM.[name]    Nausea/Vomiting: Controlled.    Postoperative hydration reviewed and adequate.    Pain:  Pain Scale 1: Numeric (0 - 10) (10/29/15 1228)  Pain Intensity 1: 0 (10/29/15 1228)   Managed.    Neurological Status:   Neuro (WDL): Within Defined Limits (10/29/15 1228)   At baseline.    Mental Status and Level of Consciousness: Arousable.    Pulmonary Status:   O2 Device: Non-rebreather mask (10/29/15 1228)   Adequate oxygenation and airway patent.    Complications related to anesthesia: None    Post-anesthesia assessment completed. No concerns.    Patient has met all discharge requirements.    Signed By: Caro Laroche, CRNA    October 29, 2015

## 2015-10-29 NOTE — Anesthesia Pre-Procedure Evaluation (Signed)
Anesthetic History   No history of anesthetic complications            Review of Systems / Medical History  Patient summary reviewed, nursing notes reviewed and pertinent labs reviewed    Pulmonary  Within defined limits                 Neuro/Psych   Within defined limits           Cardiovascular              Hyperlipidemia  Pertinent negatives: No hypertension, past MI, CAD, PAD, dysrhythmias, angina and CHF  Exercise tolerance: >4 METS     GI/Hepatic/Renal  Within defined limits              Endo/Other        Arthritis  Pertinent negatives: No diabetes, hypothyroidism, hyperthyroidism, obesity and blood dyscrasia   Other Findings              Physical Exam    Airway  Mallampati: II  TM Distance: 4 - 6 cm  Neck ROM: normal range of motion   Mouth opening: Normal     Cardiovascular  Regular rate and rhythm,  S1 and S2 normal,  no murmur, click, rub, or gallop             Dental  No notable dental hx       Pulmonary  Breath sounds clear to auscultation               Abdominal  GI exam deferred       Other Findings            Anesthetic Plan    ASA: 2  Anesthesia type: general          Induction: Intravenous  Anesthetic plan and risks discussed with: Patient and Spouse      GA/LMA

## 2015-10-29 NOTE — Interval H&P Note (Signed)
H&P Update:  Diane Frank was seen and examined.  History and physical has been reviewed. The patient has been examined. There have been no significant clinical changes since the completion of the originally dated History and Physical.    Signed By: Lorre NickLaura R Alveena Taira, MD     October 29, 2015 10:11 AM

## 2015-10-29 NOTE — Op Note (Signed)
Bookmark Copy       Posterior Colporrhaphy, Perineorrhaphy  ??  Pre Op Diagnosis: Stage IV rectocele; Stage II apical prolapse  Post Op Diagnosis: SAA  Procedure: 1) Posterior colporrhaphy 2) vaginal approach colpopexy 3) perineorrhaphy  Surgeon: Duanne GuessLaura Peri Kreft, M.D.  Anesthesia: LMA, Dr. Maree KrabbeLothes  EBL: 25 mL  Findings: Stage IV rectocele, stage II apical prolapse  Complications: none  Disposition: to the RR in stable condition  ??  Procedure:  The patient was brought to the OR where patient identification and surgery identification were completed with the patient and the OR team. She was placed on the OR table and LMA was obtained without difficulty. SCD's were on and activated. She was placed in the lithotomy position in adjustable Allen stirrups and prepped and draped in the normal sterile fashion. Patient identification and surgery identification were then completed with the surgeon and the OR team. Foley catheter was placed.   The posterior vaginal mucosa was then grasped at the apex of the defect and at the introitus bilaterally. The rectovaginal space was infiltrated with 60 cc of a dilute solution of 1% lidocaine in 200 cc of NS. The mucosa was opened longitudinally using the Bovie on pure cut. The rectovaginal space was dissected until the sacrospinous ligament was palpable bilaterally. The Xenform graft had been pre soaked in NS and was cut to the appropriate size and shape for the assessed defect. Using the [facility] Slim it was attached to the sacrospinous ligament. 3 interrupted sutures of 2-0 vicryl were used to tack the graft in place. The vaginal mucosal incision was then closed with 3-0 Vicryl is a running fashion. Skin marking for perineoplasty was completed. The area was then infiltrated with 10 cc of the lidocaine and NS solution. Using Bovie on pure cut the perineal incision was made. Perineoplasty was completed with deep supportive sutures to rebuild the perineal body. The vaginal mucosa  was then closed using 3-0 vicryl in a running and subcuticular fashion. Estrace cream on a vaginal packing was then placed. Foley catheter was removed.  Sponge, lap, needle and instrument counts were correct. The patient was transferred to the recovery room in stable condition.     Duanne GuessLaura Jamieon Lannen, M.D.            Bookmark Copy

## 2015-10-29 NOTE — Op Note (Signed)
Op Notes by  Lorre Nickordes, Lashawnda Hancox R, MD at 10/29/15 1225                Author: Lorre Nickordes, Plummer Matich R, MD  Service: Obstetrics & Gynecology  Author Type: Physician       Filed: 10/29/15 1227  Date of Service: 10/29/15 1225  Status: Signed          Editor: Lorre Nickordes, Saddie Sandeen R, MD (Physician)                   Bookmark Copy          Posterior Colporrhaphy, Perineorrhaphy   ??   Pre Op Diagnosis: Stage IV rectocele; Stage II apical prolapse   Post Op Diagnosis: SAA   Procedure: 1) Posterior colporrhaphy 2) vaginal approach colpopexy 3) perineorrhaphy   Surgeon: Duanne GuessLaura Tykel Badie, M.D.   Anesthesia: LMA, Dr. Maree KrabbeLothes   EBL: 25 mL   Findings: Stage IV rectocele, stage II apical prolapse   Complications: none   Disposition: to the RR in stable condition   ??   Procedure:   The patient was brought to the OR where patient identification and surgery identification were completed with the patient and the OR team. She was placed on the OR table and LMA was obtained without difficulty. SCD's were on and activated. She was placed  in the lithotomy position in adjustable Allen stirrups and prepped and draped in the normal sterile fashion. Patient identification and surgery identification were then completed with the surgeon and the OR team. Foley catheter was placed.    The posterior vaginal mucosa was then grasped at the apex of the defect and at the introitus bilaterally. The rectovaginal space was infiltrated with 60 cc of a dilute solution of 1% lidocaine in 200 cc of NS. The mucosa was opened longitudinally using  the Bovie on pure cut. The rectovaginal space was dissected until the sacrospinous ligament was palpable bilaterally. The Xenform graft had been pre soaked in NS and was cut to the appropriate size and shape for the assessed defect. Using the [facility]  it was attached to the sacrospinous ligament. 3 interrupted sutures of 2-0 vicryl were used to tack the graft in place. The vaginal mucosal incision was then closed with 3-0 Vicryl  is a running fashion. Skin marking for perineoplasty was completed. The  area was then infiltrated with 10 cc of the lidocaine and NS solution. Using Bovie on pure cut the perineal incision was made. Perineoplasty was completed with deep supportive sutures to rebuild the perineal body. The vaginal mucosa was then closed using  3-0 vicryl in a running and subcuticular fashion. Estrace cream on a vaginal packing was then placed. Foley catheter was removed.  Sponge, lap, needle and instrument counts were correct. The patient was transferred to the recovery room in stable condition.       Duanne GuessLaura Rosland Riding, M.D.                           Bookmark Copy

## 2015-12-20 ENCOUNTER — Encounter: Payer: Self-pay | Admitting: Emergency Medicine

## 2015-12-20 ENCOUNTER — Emergency Department
Admission: EM | Admit: 2015-12-20 | Discharge: 2015-12-20 | Disposition: A | Discharge status: Home / Self Care | Payer: Medicare Other | Attending: Emergency Medicine | Admitting: Emergency Medicine

## 2015-12-20 ENCOUNTER — Emergency Department: Payer: Medicare Other

## 2015-12-20 DIAGNOSIS — M1990 Unspecified osteoarthritis, unspecified site: Secondary | ICD-10-CM | POA: Diagnosis not present

## 2015-12-20 DIAGNOSIS — Z86711 Personal history of pulmonary embolism: Secondary | ICD-10-CM | POA: Insufficient documentation

## 2015-12-20 DIAGNOSIS — E119 Type 2 diabetes mellitus without complications: Secondary | ICD-10-CM | POA: Insufficient documentation

## 2015-12-20 DIAGNOSIS — Z794 Long term (current) use of insulin: Secondary | ICD-10-CM | POA: Diagnosis not present

## 2015-12-20 DIAGNOSIS — J209 Acute bronchitis, unspecified: Secondary | ICD-10-CM | POA: Diagnosis not present

## 2015-12-20 DIAGNOSIS — I10 Essential (primary) hypertension: Secondary | ICD-10-CM | POA: Insufficient documentation

## 2015-12-20 DIAGNOSIS — R05 Cough: Secondary | ICD-10-CM | POA: Diagnosis present

## 2015-12-20 DIAGNOSIS — Z79899 Other long term (current) drug therapy: Secondary | ICD-10-CM | POA: Insufficient documentation

## 2015-12-20 MED ORDER — ALBUTEROL SULFATE HFA 108 (90 BASE) MCG/ACT IN AERS: 2.0000 | INHALATION_SPRAY | RESPIRATORY_TRACT | Status: DC | PRN

## 2015-12-20 MED ORDER — AZITHROMYCIN 250 MG PO TABS: ORAL_TABLET | ORAL | Status: AC

## 2015-12-20 MED ORDER — ALBUTEROL SULFATE (2.5 MG/3ML) 0.083% IN NEBU
5.0000 mg | INHALATION_SOLUTION | Freq: Once | RESPIRATORY_TRACT | Status: DC
  Filled 2015-12-20: qty 6

## 2015-12-20 MED ORDER — IPRATROPIUM-ALBUTEROL 0.5-2.5 (3) MG/3ML IN SOLN
3.0000 mL | Freq: Once | RESPIRATORY_TRACT | Status: AC
  Administered 2015-12-20: 3 mL via RESPIRATORY_TRACT
  Filled 2015-12-20: qty 3

## 2015-12-20 NOTE — ED Notes (Signed)
Patient with cough for last 2 weeks. Had been under stress with family member passing away this last week. States breathing is harder when lying flat.

## 2015-12-20 NOTE — Discharge Instructions (Signed)

## 2015-12-20 NOTE — ED Notes (Signed)
Pt. States cough for the past two weeks worse in the past couple days.  Pt. States hx of Pulmonary embolism last year.  Pt. States difficulty sleeping and decreased appetite.

## 2015-12-20 NOTE — ED Provider Notes (Signed)
[facility] Emergency Department Provider Note  ____________________________________________  Time seen: 7:25 PM  I have reviewed the triage vital signs and the nursing notes.   HISTORY  Chief Complaint Shortness of Breath    HPI Meagan Hernandez is a 70 y.o. female who reports worsening coughing over the past 2 weeks. She started out with an upper respiratory infection with rhinorrhea and sore throat, which then progressed inoperative coughing and now she is having productive coughing over the last few days. Denies chest pain or shortness of breath but it does hurt when she coughs forcefully. She has a history of PE but is been taking Eliquis after transitioning off of Xarelto when her d-dimer levels decreased on hematology follow-up. She is followed by the [facility].     Past Medical History  Diagnosis Date  . Hypertension   . Diabetes mellitus without complication (HCC)   . Arthritis      Patient Active Problem List   Diagnosis Date Noted  . Pulmonary embolism (HCC) 03/02/2015  . Near syncope 03/02/2015  . SOB (shortness of breath) 03/02/2015     Past Surgical History  Procedure Laterality Date  . Appendectomy    . Knee arthroscopy    . Cholecystectomy       Current Outpatient Rx  Name  Route  Sig  Dispense  Refill  . albuterol (PROVENTIL HFA) 108 (90 Base) MCG/ACT inhaler   Inhalation   Inhale 2 puffs into the lungs every 4 (four) hours as needed for wheezing or shortness of breath.   1 Inhaler   0   . atorvastatin (LIPITOR) 10 MG tablet   Oral   Take 5 mg by mouth daily.         [name] azithromycin (ZITHROMAX Z-PAK) 250 MG tablet      Take 2 tablets (500 mg) on  Day 1,  followed by 1 tablet (250 mg) once daily on Days 2 through 5.   6 each   0   . hydrochlorothiazide (HYDRODIURIL) 12.5 MG tablet   Oral   Take 12.5 mg by mouth daily.         . insulin aspart (NOVOLOG) 100 UNIT/ML injection   Subcutaneous   Inject  21 Units into the skin 3 (three) times daily with meals.         . insulin glargine (LANTUS) 100 UNIT/ML injection   Subcutaneous   Inject 61 Units into the skin at bedtime.         [name] lisinopril (PRINIVIL,ZESTRIL) 5 MG tablet   Oral   Take 5 mg by mouth daily.         . metoprolol tartrate (LOPRESSOR) 25 MG tablet   Oral   Take 25 mg by mouth 2 (two) times daily.         [name] omeprazole (PRILOSEC) 20 MG capsule   Oral   Take 20 mg by mouth 2 (two) times daily.         . Rivaroxaban (XARELTO STARTER PACK) 15 & 20 MG TBPK      Take as directed on package: Start with one 15mg  tablet by mouth twice a day with food. On Day 22, switch to one 20mg  tablet once a day with food.   51 each   0   . vitamin E 400 UNIT capsule   Oral   Take 400 Units by mouth daily.            Allergies Other and Sulfa  antibiotics   No family history on file.  Social History Social History  Substance Use Topics  . Smoking status: Never Smoker   . Smokeless tobacco: None  . Alcohol Use: No    Review of Systems  Constitutional:   No fever or chills.  Eyes:   No vision changes.  ENT:   Positive sore throat and rhinorrhea. Cardiovascular:   No chest pain. Respiratory:   No dyspnea Positive productive cough Gastrointestinal:   Negative for abdominal pain, vomiting and diarrhea.  Genitourinary:   Negative for dysuria or difficulty urinating. Musculoskeletal:   Negative for focal pain or swelling Neurological:   Negative for headaches 10-point ROS otherwise negative.  ____________________________________________   PHYSICAL EXAM:  VITAL SIGNS: ED Triage Vitals  Enc Vitals Group     BP 12/20/15 1704 124/56 mmHg     Pulse Rate 12/20/15 1704 61     Resp 12/20/15 1704 18     Temp 12/20/15 1704 98.4 F (36.9 C)     Temp Source 12/20/15 1704 Oral     SpO2 12/20/15 1704 96 %     Weight 12/20/15 1704 253 lb (114.76 kg)     Height 12/20/15 1704 5\' 2"  (1.575 m)     Head Cir --       Peak Flow --      Pain Score 12/20/15 1705 8     Pain Loc --      Pain Edu? --      Excl. in GC? --     Vital signs reviewed, nursing assessments reviewed.   Constitutional:   Alert and oriented. Well appearing and in no distress. Eyes:   No scleral icterus. No conjunctival pallor. PERRL. EOMI.  No nystagmus. ENT   Head:   Normocephalic and atraumatic.   Nose:   Positive nasal congestion. No septal hematoma   Mouth/Throat:   MMM, no pharyngeal erythema. No peritonsillar mass.    Neck:   No stridor. No SubQ emphysema. No meningismus. Hematological/Lymphatic/Immunilogical:   No cervical lymphadenopathy. Cardiovascular:   RRR. Symmetric bilateral radial and DP pulses.  No murmurs.  Respiratory:   Normal respiratory effort without tachypnea nor retractions. Breath sounds are clear and equal bilaterally. There is mild expiratory wheezing with coughing.. Gastrointestinal:   Soft and nontender. Non distended. There is no CVA tenderness.  No rebound, rigidity, or guarding. Genitourinary:   deferred Musculoskeletal:   Nontender with normal range of motion in all extremities. No joint effusions.  No lower extremity tenderness.  No edema. Neurologic:   Normal speech and language.  CN 2-10 normal. Motor grossly intact. No gross focal neurologic deficits are appreciated.  Skin:    Skin is warm, dry and intact. No rash noted.  No petechiae, purpura, or bullae.  ____________________________________________    LABS (pertinent positives/negatives) (all labs ordered are listed, but only abnormal results are displayed) Labs Reviewed - No data to display ____________________________________________   EKG  Interpreted by me Sinus bradycardia rate of 58, normal axis and intervals. Normal QRS ST segments and T waves. There are voltage criteria for LVH in the high lateral leads.  ____________________________________________    RADIOLOGY  Chest x-ray shows peribronchial cuffing  consistent with bronchitis, no pneumothorax or acute infiltrate.  ____________________________________________   PROCEDURES   ____________________________________________   INITIAL IMPRESSION / ASSESSMENT AND PLAN / ED COURSE  Pertinent labs & imaging results that were available during my care of the patient were reviewed by me and considered in my medical decision  making (see chart for details).  Patient well appearing no acute distress. Presents with worsening URI and bronchitis symptoms. She is diabetic and obese, somewhat worried that she may be developing an atypical pneumonia. She is allergic to steroids so we will avoid those for now but I will prescribe her albuterol and azithromycin. 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 Low suspicion for ACS PE dissection pneumothorax or sepsis. She'll follow-up with her primary care doctor. She is artery taking guaifenesin and I encouraged her to continue. Term precautions given.     ____________________________________________   FINAL CLINICAL IMPRESSION(S) / ED DIAGNOSES  Final diagnoses:  Acute bronchitis, unspecified organism  Cough   Portions of this note were generated with dragon dictation software. Dictation errors may occur despite best attempts at proofreading.   Sharman Cheek, MD 12/20/15 2004

## 2018-01-16 ENCOUNTER — Emergency Department
Admission: EM | Admit: 2018-01-16 | Discharge: 2018-01-16 | Disposition: A | Discharge status: Home / Self Care | Payer: Medicare Other | Attending: Emergency Medicine | Admitting: Emergency Medicine

## 2018-01-16 ENCOUNTER — Emergency Department: Payer: Medicare Other

## 2018-01-16 DIAGNOSIS — Z7901 Long term (current) use of anticoagulants: Secondary | ICD-10-CM | POA: Insufficient documentation

## 2018-01-16 DIAGNOSIS — J9801 Acute bronchospasm: Secondary | ICD-10-CM | POA: Insufficient documentation

## 2018-01-16 DIAGNOSIS — R0602 Shortness of breath: Secondary | ICD-10-CM | POA: Diagnosis present

## 2018-01-16 DIAGNOSIS — Z794 Long term (current) use of insulin: Secondary | ICD-10-CM | POA: Insufficient documentation

## 2018-01-16 DIAGNOSIS — R05 Cough: Secondary | ICD-10-CM | POA: Insufficient documentation

## 2018-01-16 DIAGNOSIS — I10 Essential (primary) hypertension: Secondary | ICD-10-CM | POA: Insufficient documentation

## 2018-01-16 DIAGNOSIS — E119 Type 2 diabetes mellitus without complications: Secondary | ICD-10-CM | POA: Insufficient documentation

## 2018-01-16 DIAGNOSIS — R059 Cough, unspecified: Secondary | ICD-10-CM

## 2018-01-16 DIAGNOSIS — Z79899 Other long term (current) drug therapy: Secondary | ICD-10-CM | POA: Diagnosis not present

## 2018-01-16 LAB — CBC
HCT: 33.9 % — ABNORMAL LOW (ref 35.0–47.0)
Hemoglobin: 11.5 g/dL — ABNORMAL LOW (ref 12.0–16.0)
MCH: 28.3 pg (ref 26.0–34.0)
MCHC: 33.9 g/dL (ref 32.0–36.0)
MCV: 83.5 fL (ref 80.0–100.0)
PLATELETS: 377 10*3/uL (ref 150–440)
RBC: 4.06 MIL/uL (ref 3.80–5.20)
RDW: 14.5 % (ref 11.5–14.5)
WBC: 10.9 10*3/uL (ref 3.6–11.0)

## 2018-01-16 LAB — BASIC METABOLIC PANEL
Anion gap: 8 (ref 5–15)
BUN: 24 mg/dL — ABNORMAL HIGH (ref 6–20)
CALCIUM: 8.9 mg/dL (ref 8.9–10.3)
CO2: 27 mmol/L (ref 22–32)
Chloride: 99 mmol/L — ABNORMAL LOW (ref 101–111)
Creatinine, Ser: 1.15 mg/dL — ABNORMAL HIGH (ref 0.44–1.00)
GFR, EST AFRICAN AMERICAN: 54 mL/min — AB (ref >60)
GFR, EST NON AFRICAN AMERICAN: 46 mL/min — AB (ref >60)
GLUCOSE: 271 mg/dL — AB (ref 65–99)
Potassium: 4.4 mmol/L (ref 3.5–5.1)
Sodium: 134 mmol/L — ABNORMAL LOW (ref 135–145)

## 2018-01-16 LAB — URINALYSIS, COMPLETE (UACMP) WITH MICROSCOPIC
Bilirubin Urine: NEGATIVE
GLUCOSE, UA: 50 mg/dL — AB
Hgb urine dipstick: NEGATIVE
Ketones, ur: NEGATIVE mg/dL
Leukocytes, UA: NEGATIVE
Nitrite: NEGATIVE
PH: 5 (ref 5.0–8.0)
Protein, ur: NEGATIVE mg/dL
SPECIFIC GRAVITY, URINE: 1.012 (ref 1.005–1.030)
Squamous Epithelial / LPF: NONE SEEN (ref 0–5)

## 2018-01-16 LAB — TROPONIN I: Troponin I: <0.03 ng/mL (ref <0.03)

## 2018-01-16 LAB — BRAIN NATRIURETIC PEPTIDE: B Natriuretic Peptide: 101 pg/mL — ABNORMAL HIGH (ref 0.0–100.0)

## 2018-01-16 MED ORDER — ALBUTEROL SULFATE HFA 108 (90 BASE) MCG/ACT IN AERS: INHALATION_SPRAY | RESPIRATORY_TRACT | 1 refills | Status: AC

## 2018-01-16 MED ORDER — ACETAMINOPHEN 325 MG PO TABS
ORAL_TABLET | ORAL | Status: AC
  Filled 2018-01-16: qty 2

## 2018-01-16 MED ORDER — ALBUTEROL SULFATE (2.5 MG/3ML) 0.083% IN NEBU
5.0000 mg | INHALATION_SOLUTION | Freq: Once | RESPIRATORY_TRACT | Status: AC
  Administered 2018-01-16: 5 mg via RESPIRATORY_TRACT
  Filled 2018-01-16: qty 6

## 2018-01-16 NOTE — ED Notes (Signed)
Pt discharged to home.  Family member driving.  Discharge instructions reviewed.  Verbalized understanding.  No questions or concerns at this time.  Teach back verified.  Pt in NAD.  No items left in ED.   

## 2018-01-16 NOTE — Discharge Instructions (Signed)
Your workup in the Emergency Department today was reassuring.  We did not find any specific abnormalities.  We recommend you drink plenty of fluids, take your regular medications and/or any new ones prescribed today, and follow up with the doctor(s) listed in these documents as recommended.  Return to the Emergency Department if you develop new or worsening symptoms that concern you.  

## 2018-01-16 NOTE — ED Triage Notes (Signed)
Pt brought in by [facility] with shortness of breath and dry hacking cough.  Pt reports she has coughed so hard she was throwing up.  Pt reports previous hx of PE and states she is currently on eliquis for this.  Pt is A&Ox4, coughing repeatedly.

## 2018-01-16 NOTE — ED Provider Notes (Signed)
[facility] Emergency Department Provider Note  ____________________________________________   First MD Initiated Contact with Patient 01/16/18 0147     (approximate)  I have reviewed the triage vital signs and the nursing notes.   HISTORY  Chief Complaint Shortness of Breath    HPI Meagan Hernandez is a 72 y.o. female with medical history as listed below presents by EMS for evaluation of shortness of breath and severe cough.  The cough started acutely tonight after she took her medications and she has not been able to stop.  She has thrown up a little bit due to the heavy coughing.  She has a prior history of PE and lung disease and is currently on Eliquis.  She has not had this kind of reaction before when taking medications and she does not believe that she got choked.  She was receiving a breathing treatment at the time I saw her in her cough and was already resolving, she says that it was severe but now it is mild.  She has chronic pain in her joints from arthritis but that is unrelated to her current presentation.  She denies fever/chills, nausea, vomiting, chest pain, and abdominal pain.  She says she was in her usual state of health until she started coughing tonight.  She has been on antibiotics (Cipro) for a urinary tract infection recently but has no other new medications.  Past Medical History:  Diagnosis Date  . Arthritis   . Diabetes mellitus without complication (HCC)   . Hypertension     Patient Active Problem List   Diagnosis Date Noted  . Pulmonary embolism (HCC) 03/02/2015  . Near syncope 03/02/2015  . SOB (shortness of breath) 03/02/2015    Past Surgical History:  Procedure Laterality Date  . APPENDECTOMY    . CHOLECYSTECTOMY    . KNEE ARTHROSCOPY      Prior to Admission medications   Medication Sig Start Date End Date Taking? Authorizing Provider  albuterol (PROVENTIL HFA;VENTOLIN HFA) 108 (90 Base) MCG/ACT inhaler Inhale 2-4  puffs by mouth every 4 hours as needed for wheezing, cough, and/or shortness of breath 01/16/18   Loleta Rose, MD  atorvastatin (LIPITOR) 10 MG tablet Take 5 mg by mouth daily.    [provider]  azithromycin (ZITHROMAX Z-PAK) 250 MG tablet Take 2 tablets (500 mg) on  Day 1,  followed by 1 tablet (250 mg) once daily on Days 2 through 5. 12/20/15   Sharman Cheek, MD  hydrochlorothiazide (HYDRODIURIL) 12.5 MG tablet Take 12.5 mg by mouth daily.    [provider]  insulin aspart (NOVOLOG) 100 UNIT/ML injection Inject 21 Units into the skin 3 (three) times daily with meals.    [provider]  insulin glargine (LANTUS) 100 UNIT/ML injection Inject 61 Units into the skin at bedtime.    [provider]  lisinopril (PRINIVIL,ZESTRIL) 5 MG tablet Take 5 mg by mouth daily.    [provider]  metoprolol tartrate (LOPRESSOR) 25 MG tablet Take 25 mg by mouth 2 (two) times daily.    [provider]  omeprazole (PRILOSEC) 20 MG capsule Take 20 mg by mouth 2 (two) times daily.    [provider]  Rivaroxaban (XARELTO STARTER PACK) 15 & 20 MG TBPK Take as directed on package: Start with one 15mg  tablet by mouth twice a day with food. On Day 22, switch to one 20mg  tablet once a day with food. 03/03/15   Altamese Dilling, MD  vitamin  E 400 UNIT capsule Take 400 Units by mouth daily.    [provider]    Allergies Other and Sulfa antibiotics  No family history on file.  Social History Social History   Tobacco Use  . Smoking status: Never Smoker  Substance Use Topics  . Alcohol use: No  . Drug use: No    Review of Systems Constitutional: No fever/chills Eyes: No visual changes. ENT: No sore throat. Cardiovascular: Denies chest pain. Respiratory: Severe cough and shortness of breath, acute in onset, as described above Gastrointestinal: No abdominal pain.  No nausea, no vomiting.  No diarrhea.  No  constipation. Genitourinary: Negative for dysuria. Musculoskeletal: Negative for neck pain.  Negative for back pain. Integumentary: Negative for rash. Neurological: Negative for headaches, focal weakness or numbness.   ____________________________________________   PHYSICAL EXAM:  VITAL SIGNS: ED Triage Vitals  Enc Vitals Group     BP 01/16/18 0114 (!) 133/59     Pulse Rate 01/16/18 0114 61     Resp 01/16/18 0114 12     Temp 01/16/18 0114 98.2 F (36.8 C)     Temp Source 01/16/18 0114 Oral     SpO2 01/16/18 0114 97 %     Weight 01/16/18 0115 114.8 kg (253 lb)     Height 01/16/18 0115 1.549 m (5\' 1" )     Head Circumference --      Peak Flow --      Pain Score 01/16/18 0115 5     Pain Loc --      Pain Edu? --      Excl. in GC? --     Constitutional: Alert and oriented. Well appearing and in no acute distress. Eyes: Conjunctivae are normal.  Head: Atraumatic. Nose: No congestion/rhinnorhea. Mouth/Throat: Mucous membranes are moist. Neck: No stridor.  No meningeal signs.   Cardiovascular: Normal rate, regular rhythm. Good peripheral circulation. Grossly normal heart sounds. Respiratory: Normal respiratory effort.  No retractions. Lungs CTAB.  Frequent cough but improved from prior Gastrointestinal: Soft and nontender. No distention.  Musculoskeletal: No lower extremity tenderness nor edema. No gross deformities of extremities. Neurologic:  Normal speech and language. No gross focal neurologic deficits are appreciated.  Skin:  Skin is warm, dry and intact. No rash noted. Psychiatric: Mood and affect are normal. Speech and behavior are normal.  ____________________________________________   LABS (all labs ordered are listed, but only abnormal results are displayed)  Labs Reviewed  CBC - Abnormal; Notable for the following components:      Result Value   Hemoglobin 11.5 (*)    HCT 33.9 (*)    All other components within normal limits  BASIC METABOLIC PANEL -  Abnormal; Notable for the following components:   Sodium 134 (*)    Chloride 99 (*)    Glucose, Bld 271 (*)    BUN 24 (*)    Creatinine, Ser 1.15 (*)    GFR calc non Af Amer 46 (*)    GFR calc Af Amer 54 (*)    All other components within normal limits  BRAIN NATRIURETIC PEPTIDE - Abnormal; Notable for the following components:   B Natriuretic Peptide 101.0 (*)    All other components within normal limits  URINALYSIS, COMPLETE (UACMP) WITH MICROSCOPIC - Abnormal; Notable for the following components:   Color, Urine YELLOW (*)    APPearance CLEAR (*)    Glucose, UA 50 (*)    Bacteria, UA RARE (*)    All other components within normal  limits  TROPONIN I   ____________________________________________  EKG  ED ECG REPORT I, Loleta Rose, the attending physician, personally viewed and interpreted this ECG.  Date: 01/16/2018 EKG Time: 01:20 Rate: 66 Rhythm: normal sinus rhythm QRS Axis: normal Intervals: normal ST/T Wave abnormalities: Inverted T waves in leads III and aVF, otherwise unremarkable Narrative Interpretation: no evidence of acute ischemia  ____________________________________________  RADIOLOGY I, Loleta Rose, personally viewed and evaluated these images (plain radiographs) as part of my medical decision making, as well as reviewing the written report by the radiologist.  ED MD interpretation: No indication of acute disease such as pneumonia.  Official radiology report(s): Dg Chest 2 View  Result Date: 01/16/2018 CLINICAL DATA:  Shortness of breath and dry cough.  Vomiting. EXAM: CHEST - 2 VIEW COMPARISON:  12/20/2015 FINDINGS: Normal heart size and pulmonary vascularity. No focal airspace disease or consolidation in the lungs. No blunting of costophrenic angles. No pneumothorax. Mediastinal contours appear intact. Degenerative changes in the spine. IMPRESSION: No active cardiopulmonary disease. Electronically Signed   By: Burman Nieves M.D.   On: 01/16/2018  01:48    ____________________________________________   PROCEDURES  Critical Care performed: No   Procedure(s) performed:   Procedures   ____________________________________________   INITIAL IMPRESSION / ASSESSMENT AND PLAN / ED COURSE  As part of my medical decision making, I reviewed the following data within the electronic MEDICAL RECORD NUMBER Nursing notes reviewed and incorporated, Labs reviewed , EKG interpreted , Old chart reviewed and Radiograph reviewed     Differential diagnosis includes, but is not limited to, bronchitis, aspiration, pneumonia, obstructive pulmonary disease, etc.  Given that the symptoms started right after the patient took her medicines I think it is likely she may have aspirated.  However she is feeling much better after breathing treatment.  Chest x-ray is clear, Labs are generally reassuring with some hyperglycemia but otherwise unremarkable BMP, very slightly elevated BNP which is of uncertain significance, normal CBC, negative troponin, unremarkable EKG.  The patient has been observed for multiple hours in the emergency department is not having any additional trouble breathing.  I will prescribe an albuterol inhaler in case this happens for again but encouraged her to continue taking her regular outpatient medicines and follow-up with her regular doctor.  I gave my usual and customary return precautions.      ____________________________________________  FINAL CLINICAL IMPRESSION(S) / ED DIAGNOSES  Final diagnoses:  Cough  Bronchospasm     MEDICATIONS GIVEN DURING THIS VISIT:  Medications  albuterol (PROVENTIL) (2.5 MG/3ML) 0.083% nebulizer solution 5 mg (5 mg Nebulization Given 01/16/18 0139)     ED Discharge Orders        Ordered    albuterol (PROVENTIL HFA;VENTOLIN HFA) 108 (90 Base) MCG/ACT inhaler     01/16/18 0438       Note:  This document was prepared using Dragon voice recognition software and may include unintentional  dictation errors.    Loleta Rose, MD 01/16/18 1015

## 2019-09-25 DEATH — deceased

## 2019-12-06 IMAGING — CR DG CHEST 2V
2 series · 3 of 3 positions shown · non-contrast
Comparison: 12/20/2015

CLINICAL DATA: Shortness of breath and dry cough.  Vomiting.

EXAM:
CHEST - 2 VIEW

[Series 2: chest lat · 0.14mm/px · 2 of 2 slices shown]
[im 1/2]
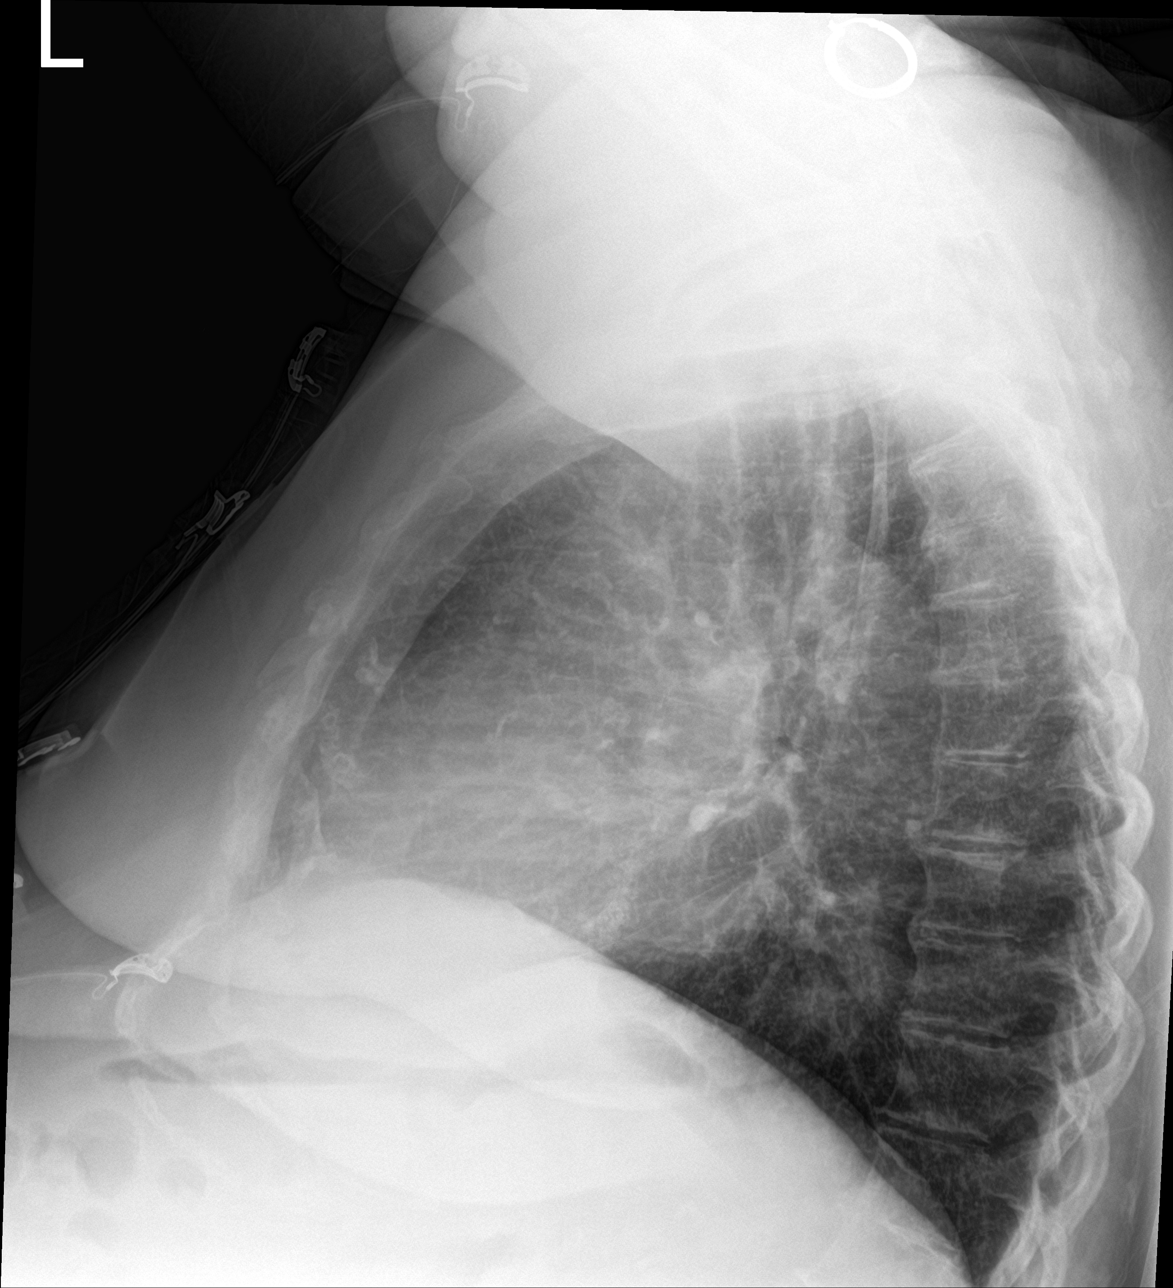
[im 2/2]
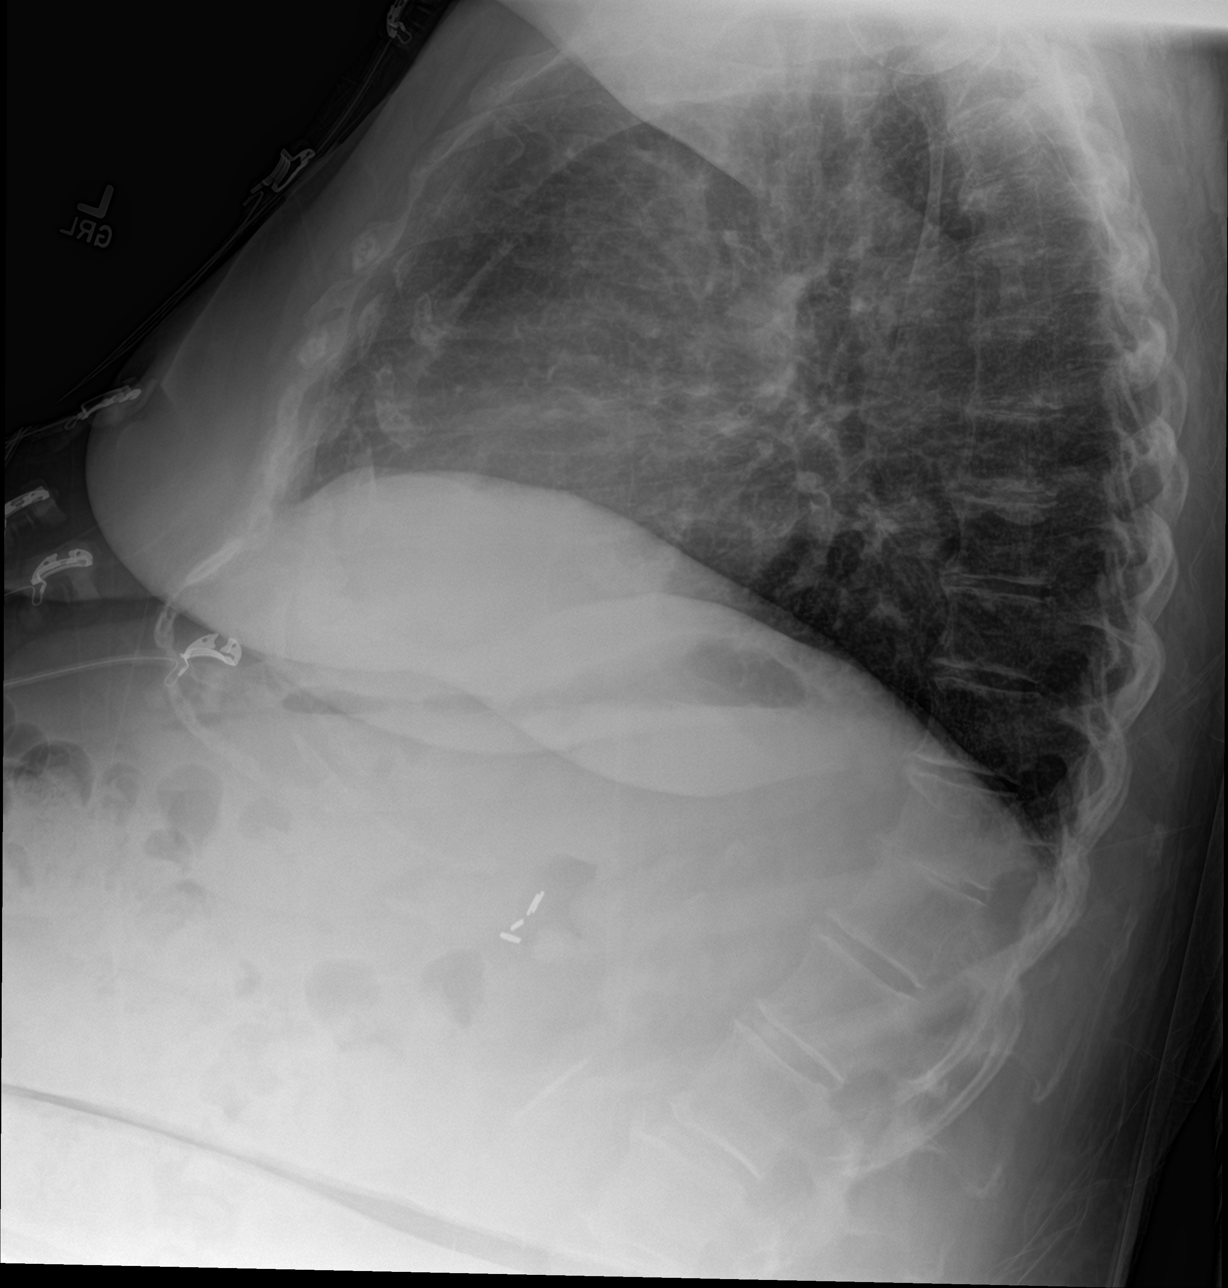

[chest ap]
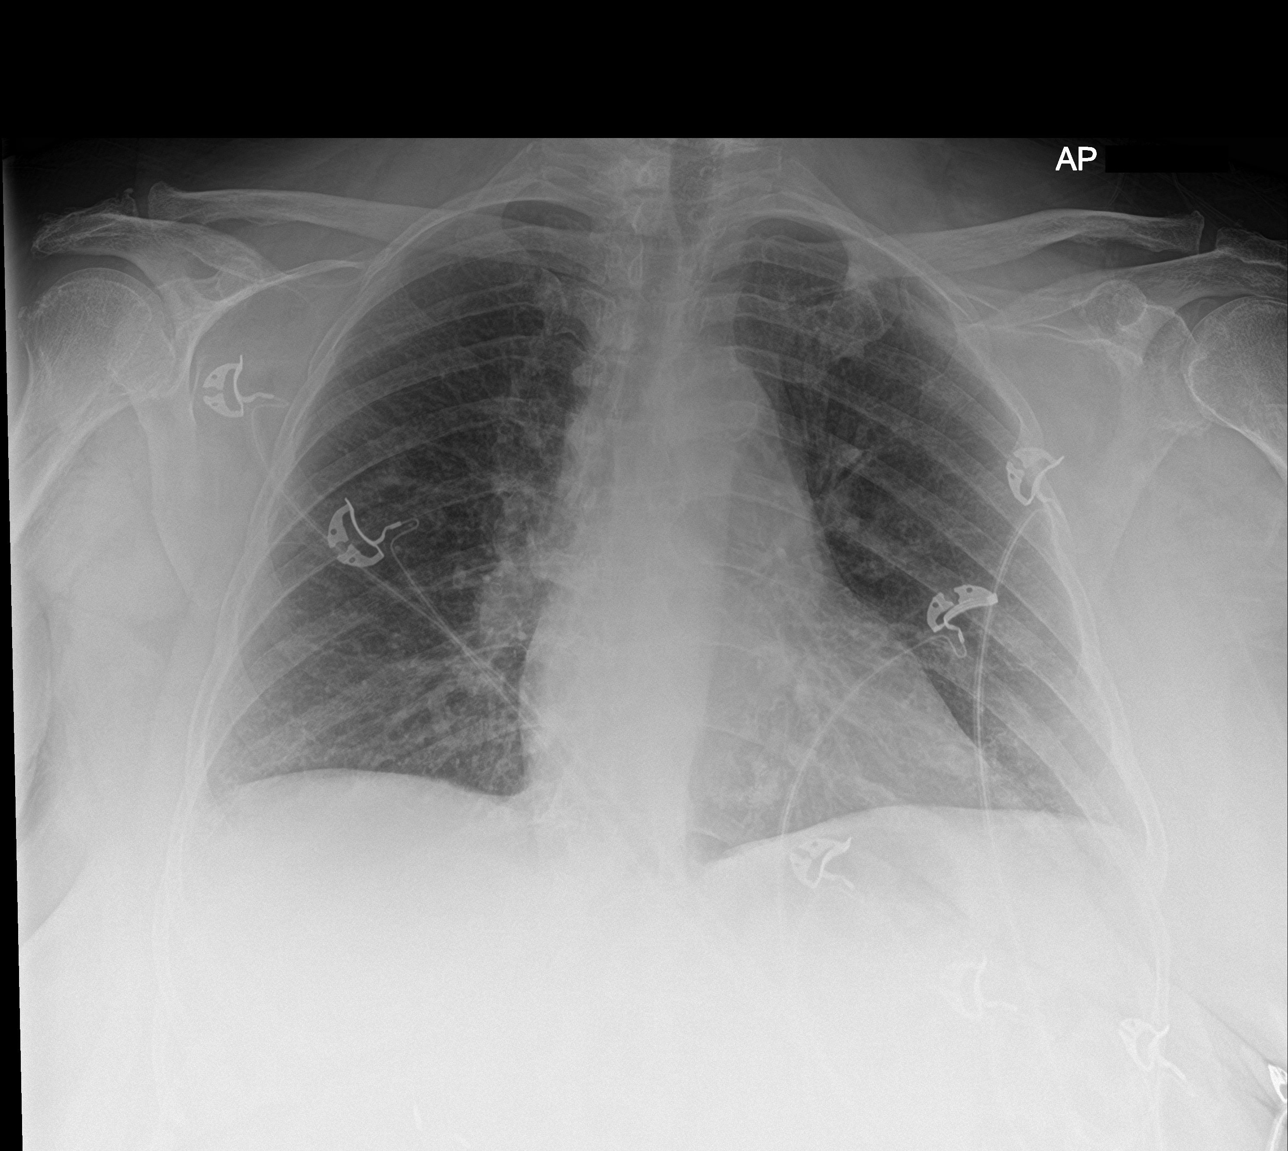

[3 of 3 positions shown; findings below may reference images not displayed]

FINDINGS: Normal heart size and pulmonary vascularity. No focal airspace
disease or consolidation in the lungs. No blunting of costophrenic
angles. No pneumothorax. Mediastinal contours appear intact.
Degenerative changes in the spine.
IMPRESSION: No active cardiopulmonary disease.
